# Patient Record
Sex: Male | Born: 1955 | Race: White | Hispanic: No | Marital: Married | Smoking: Former smoker
Health system: Southern US, Community
[De-identification: ages and names within clinical notes are randomized; demographics above are authoritative.]

## PROBLEM LIST (undated history)

## (undated) DIAGNOSIS — K2270 Barrett's esophagus without dysplasia: Secondary | ICD-10-CM

## (undated) DIAGNOSIS — K219 Gastro-esophageal reflux disease without esophagitis: Secondary | ICD-10-CM

## (undated) HISTORY — DX: Gastro-esophageal reflux disease without esophagitis: K21.9

## (undated) HISTORY — DX: Barrett's esophagus without dysplasia: K22.70

---

## 1999-12-29 ENCOUNTER — Other Ambulatory Visit: Admission: RE | Admit: 1999-12-29 | Discharge: 1999-12-29 | Payer: Self-pay | Admitting: Gastroenterology

## 2000-01-08 ENCOUNTER — Encounter (HOSPITAL_COMMUNITY): Admission: RE | Admit: 2000-01-08 | Discharge: 2000-04-07 | Payer: Self-pay | Admitting: Gastroenterology

## 2000-01-08 ENCOUNTER — Encounter: Payer: Self-pay | Admitting: Gastroenterology

## 2018-07-04 ENCOUNTER — Other Ambulatory Visit (INDEPENDENT_AMBULATORY_CARE_PROVIDER_SITE_OTHER): Payer: BLUE CROSS/BLUE SHIELD

## 2018-07-04 ENCOUNTER — Ambulatory Visit: Payer: BLUE CROSS/BLUE SHIELD | Admitting: Internal Medicine

## 2018-07-04 ENCOUNTER — Encounter: Payer: Self-pay | Admitting: Internal Medicine

## 2018-07-04 ENCOUNTER — Ambulatory Visit: Payer: Self-pay | Admitting: Internal Medicine

## 2018-07-04 VITALS — BP 116/74 | HR 127 | Ht 70.0 in | Wt 152.0 lb

## 2018-07-04 DIAGNOSIS — R5383 Other fatigue: Secondary | ICD-10-CM | POA: Diagnosis not present

## 2018-07-04 DIAGNOSIS — K22719 Barrett's esophagus with dysplasia, unspecified: Secondary | ICD-10-CM | POA: Diagnosis not present

## 2018-07-04 DIAGNOSIS — Z1159 Encounter for screening for other viral diseases: Secondary | ICD-10-CM

## 2018-07-04 DIAGNOSIS — K219 Gastro-esophageal reflux disease without esophagitis: Secondary | ICD-10-CM | POA: Insufficient documentation

## 2018-07-04 DIAGNOSIS — Z0001 Encounter for general adult medical examination with abnormal findings: Secondary | ICD-10-CM

## 2018-07-04 DIAGNOSIS — Z114 Encounter for screening for human immunodeficiency virus [HIV]: Secondary | ICD-10-CM | POA: Diagnosis not present

## 2018-07-04 DIAGNOSIS — E7800 Pure hypercholesterolemia, unspecified: Secondary | ICD-10-CM

## 2018-07-04 DIAGNOSIS — R634 Abnormal weight loss: Secondary | ICD-10-CM | POA: Diagnosis not present

## 2018-07-04 DIAGNOSIS — R630 Anorexia: Secondary | ICD-10-CM | POA: Diagnosis not present

## 2018-07-04 DIAGNOSIS — K2270 Barrett's esophagus without dysplasia: Secondary | ICD-10-CM

## 2018-07-04 HISTORY — DX: Barrett's esophagus without dysplasia: K22.70

## 2018-07-04 LAB — CBC WITH DIFFERENTIAL/PLATELET
BASOS ABS: 0.1 10*3/uL (ref 0.0–0.1)
Basophils Relative: 0.6 % (ref 0.0–3.0)
EOS ABS: 0.1 10*3/uL (ref 0.0–0.7)
Eosinophils Relative: 0.7 % (ref 0.0–5.0)
HEMATOCRIT: 40.4 % (ref 39.0–52.0)
Hemoglobin: 13.5 g/dL (ref 13.0–17.0)
LYMPHS ABS: 1.2 10*3/uL (ref 0.7–4.0)
LYMPHS PCT: 14.5 % (ref 12.0–46.0)
MCHC: 33.4 g/dL (ref 30.0–36.0)
MCV: 83.5 fl (ref 78.0–100.0)
MONOS PCT: 8.6 % (ref 3.0–12.0)
Monocytes Absolute: 0.7 10*3/uL (ref 0.1–1.0)
NEUTROS ABS: 6.3 10*3/uL (ref 1.4–7.7)
NEUTROS PCT: 75.6 % (ref 43.0–77.0)
PLATELETS: 368 10*3/uL (ref 150.0–400.0)
RBC: 4.84 Mil/uL (ref 4.22–5.81)
RDW: 15.6 % — ABNORMAL HIGH (ref 11.5–15.5)
WBC: 8.4 10*3/uL (ref 4.0–10.5)

## 2018-07-04 LAB — URINALYSIS, ROUTINE W REFLEX MICROSCOPIC
Hgb urine dipstick: NEGATIVE
Leukocytes, UA: NEGATIVE
NITRITE: NEGATIVE
SPECIFIC GRAVITY, URINE: 1.025 (ref 1.000–1.030)
TOTAL PROTEIN, URINE-UPE24: 30 — AB
UROBILINOGEN UA: 2 — AB (ref 0.0–1.0)
Urine Glucose: NEGATIVE
pH: 5.5 (ref 5.0–8.0)

## 2018-07-04 LAB — SEDIMENTATION RATE: SED RATE: 63 mm/h — AB (ref 0–20)

## 2018-07-04 NOTE — Assessment & Plan Note (Addendum)
High suspicion for malignancy related wt loss and lower appetite, for labs today as ordered  In addition to the time spent performing CPE, I spent an additional 25 minutes face to face,in which greater than 50% of this time was spent in counseling and coordination of care for patient's acute illness as documented, including the differential dx, treatment, further evaluation and other management of wt loss, fatigue, appetite loss, GERD, barretts

## 2018-07-04 NOTE — Progress Notes (Signed)
Subjective:    Patient ID: John Logan, male    DOB: July 18, 1956, 62 y.o.   MRN: [id number]  HPI  Here for wellness and establish as new pt;  Pt denies Chest pain, worsening SOB, DOE, wheezing, orthopnea, PND, worsening LE edema, palpitations, dizziness or syncope.  Pt denies neurological change such as new headache, facial or extremity weakness.  Pt denies polydipsia, polyuria. Pt states overall good compliance with treatment and medications, good tolerability, and has been trying to follow appropriate diet.  Pt denies worsening depressive symptoms, suicidal ideation or panic. No fever, night sweats, or other constitutional symptoms but has had recent onset low appetite and wt loss.  Pt states good ability with ADL's, has low fall risk, home safety reviewed and adequate, no other significant changes in hearing or vision, and not active with exercise.  Last tobacco was 27 yrs ago.   Has lost wt, none previous here a but has been 163 -167 mostly in the past.  Wt Readings from Last 3 Encounters:  07/04/18 152 lb (68.9 kg)  Has hx of barrets but last f/u was EGD with bx 20043,  On PPI since then , Denies worsening reflux, abd pain, dysphagia, n/v, bowel change or blood., but did have some hiccups with eating out once last wk.  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   Past Medical History:  Diagnosis Date  . Barrett esophagus 07/04/2018  . GERD (gastroesophageal reflux disease)    History reviewed. No pertinent surgical history.  reports that he has quit smoking. He has never used smokeless tobacco. He reports that he drank alcohol. He reports that he does not use drugs. family history includes Esophageal cancer in his father; Lung cancer in his mother. No Known Allergies Current Outpatient Medications on File Prior to Visit  Medication Sig Dispense Refill  . omeprazole (PRILOSEC OTC) 20 MG tablet Take 20 mg  by mouth daily.     No current facility-administered medications on file prior to visit.    Review of Systems Constitutional: Negative for other unusual diaphoresis, sweats, appetite or weight changes HENT: Negative for other worsening hearing loss, ear pain, facial swelling, mouth sores or neck stiffness.   Eyes: Negative for other worsening pain, redness or other visual disturbance.  Respiratory: Negative for other stridor or swelling Cardiovascular: Negative for other palpitations or other chest pain  Gastrointestinal: Negative for worsening diarrhea or loose stools, blood in stool, distention or other pain Genitourinary: Negative for hematuria, flank pain or other change in urine volume.  Musculoskeletal: Negative for myalgias or other joint swelling.  Skin: Negative for other color change, or other wound or worsening drainage.  Neurological: Negative for other syncope or numbness. Hematological: Negative for other adenopathy or swelling Psychiatric/Behavioral: Negative for hallucinations, other worsening agitation, SI, self-injury, or new decreased concentration All other system neg per pt    Objective:   Physical Exam BP 116/74   Pulse (!) 127   Ht 5\' 10"  (1.778 m)   Wt 152 lb (68.9 kg)   SpO2 98%   BMI 21.81 kg/m  VS noted, low normal wt but thinner recently Constitutional: Pt is oriented to person, place, and time. Appears well-developed and well-nourished, in no significant distress and comfortable Head: Normocephalic and atraumatic  Eyes: Conjunctivae and EOM are normal. Pupils are equal, round, and reactive to light Right Ear: External ear  normal without discharge Left Ear: External ear normal without discharge Nose: Nose without discharge or deformity Mouth/Throat: Oropharynx is without other ulcerations and moist  Neck: Normal range of motion. Neck supple. No JVD present. No tracheal deviation present or significant neck LA or mass Cardiovascular: Normal rate,  regular rhythm, normal heart sounds and intact distal pulses.   Pulmonary/Chest: WOB normal and breath sounds without rales or wheezing  Abdominal: Soft. Bowel sounds are normal. NT. No HSM  Musculoskeletal: Normal range of motion. Exhibits no edema Lymphadenopathy: Has no other cervical adenopathy.  Neurological: Pt is alert and oriented to person, place, and time. Pt has normal reflexes. No cranial nerve deficit. Motor grossly intact, Gait intact Skin: Skin is warm and dry. No rash noted or new ulcerations Psychiatric:  Has normal mood and affect. Behavior is normal without agitation\ No other exam findings No results found for: WBC, HGB, HCT, PLT, GLUCOSE, CHOL, TRIG, HDL, LDLDIRECT, LDLCALC, ALT, AST, NA, K, CL, CREATININE, BUN, CO2, TSH, PSA, INR, GLUF, HGBA1C, MICROALBUR     Assessment & Plan:

## 2018-07-04 NOTE — Assessment & Plan Note (Signed)
To GI referral as will need EGD with hx of barrets and now worsening wt loss

## 2018-07-04 NOTE — Assessment & Plan Note (Signed)

## 2018-07-04 NOTE — Assessment & Plan Note (Signed)
Also for CT abd/pelv/chest to r/o malignancy

## 2018-07-04 NOTE — Assessment & Plan Note (Signed)
To cont PPI,  to f/u any worsening symptoms or concerns

## 2018-07-04 NOTE — Assessment & Plan Note (Signed)
High suspicion for malignancy, for eval as above

## 2018-07-04 NOTE — Patient Instructions (Signed)
Please continue all other medications as before, and refills have been done if requested.  Please have the pharmacy call with any other refills you may need.  Please continue your efforts at being more active, low cholesterol diet, and weight control.  You are otherwise up to date with prevention measures today.  Please keep your appointments with your specialists as you may have planned  You will be contacted regarding the referral for: Gastroenterology, and CT scan for chest/abdomen/pelvis  Please go to the LAB in the Basement (turn left off the elevator) for the tests to be done today  You will be contacted by phone if any changes need to be made immediately.  Otherwise, you will receive a letter about your results with an explanation, but please check with MyChart first.  Please remember to sign up for MyChart if you have not done so, as this will be important to you in the future with finding out test results, communicating by private email, and scheduling acute appointments online when needed.  Please return in 6 months, or sooner if needed

## 2018-07-05 ENCOUNTER — Encounter: Payer: Self-pay | Admitting: Nurse Practitioner

## 2018-07-05 ENCOUNTER — Encounter: Payer: Self-pay | Admitting: Internal Medicine

## 2018-07-05 ENCOUNTER — Other Ambulatory Visit: Payer: Self-pay | Admitting: Internal Medicine

## 2018-07-05 DIAGNOSIS — R739 Hyperglycemia, unspecified: Secondary | ICD-10-CM

## 2018-07-05 DIAGNOSIS — R945 Abnormal results of liver function studies: Secondary | ICD-10-CM

## 2018-07-05 DIAGNOSIS — R7989 Other specified abnormal findings of blood chemistry: Secondary | ICD-10-CM

## 2018-07-05 LAB — BASIC METABOLIC PANEL
BUN: 22 mg/dL (ref 6–23)
CALCIUM: 9.8 mg/dL (ref 8.4–10.5)
CHLORIDE: 97 meq/L (ref 96–112)
CO2: 24 meq/L (ref 19–32)
CREATININE: 1.16 mg/dL (ref 0.40–1.50)
GFR: 67.74 mL/min (ref >60.00)
Glucose, Bld: 175 mg/dL — ABNORMAL HIGH (ref 70–99)
Potassium: 4.6 mEq/L (ref 3.5–5.1)
Sodium: 137 mEq/L (ref 135–145)

## 2018-07-05 LAB — HEPATITIS C ANTIBODY
Hepatitis C Ab: NONREACTIVE
SIGNAL TO CUT-OFF: 0.02 (ref <1.00)

## 2018-07-05 LAB — HIV ANTIBODY (ROUTINE TESTING W REFLEX): HIV 1&2 Ab, 4th Generation: NONREACTIVE

## 2018-07-05 LAB — LDL CHOLESTEROL, DIRECT: Direct LDL: 208 mg/dL

## 2018-07-05 LAB — LIPID PANEL
CHOL/HDL RATIO: 7
Cholesterol: 310 mg/dL — ABNORMAL HIGH (ref 0–200)
HDL: 44.1 mg/dL (ref >39.00)
NONHDL: 265.67
Triglycerides: 228 mg/dL — ABNORMAL HIGH (ref 0.0–149.0)
VLDL: 45.6 mg/dL — ABNORMAL HIGH (ref 0.0–40.0)

## 2018-07-05 LAB — PSA: PSA: 1.2 ng/mL (ref 0.10–4.00)

## 2018-07-05 LAB — HEPATIC FUNCTION PANEL
ALBUMIN: 4.1 g/dL (ref 3.5–5.2)
ALK PHOS: 427 U/L — AB (ref 39–117)
ALT: 109 U/L — ABNORMAL HIGH (ref 0–53)
AST: 159 U/L — AB (ref 0–37)
BILIRUBIN DIRECT: 1.2 mg/dL — AB (ref 0.0–0.3)
Total Bilirubin: 2 mg/dL — ABNORMAL HIGH (ref 0.2–1.2)
Total Protein: 7.2 g/dL (ref 6.0–8.3)

## 2018-07-05 LAB — VITAMIN D 25 HYDROXY (VIT D DEFICIENCY, FRACTURES): VITD: 40.03 ng/mL (ref 30.00–100.00)

## 2018-07-05 LAB — TSH: TSH: 8.14 u[IU]/mL — AB (ref 0.35–4.50)

## 2018-07-05 LAB — CORTISOL: Cortisol, Plasma: 20.9 ug/dL

## 2018-07-05 LAB — VITAMIN B12

## 2018-07-06 ENCOUNTER — Telehealth: Payer: Self-pay

## 2018-07-06 NOTE — Telephone Encounter (Signed)
-----   Message from Biagio Borg, MD sent at 07/05/2018  5:59 PM EDT ----- Please also print and send letter I have done today in my stead to the patient

## 2018-07-06 NOTE — Telephone Encounter (Signed)
Pt has been informed and expressed understanding. He stated the he would like to rest today but he would come in tomorrow to have his lab test redone.   Cecille Rubin- please follow up with patient concerning the CT appointments. Thank you!

## 2018-07-07 ENCOUNTER — Other Ambulatory Visit (INDEPENDENT_AMBULATORY_CARE_PROVIDER_SITE_OTHER): Payer: BLUE CROSS/BLUE SHIELD

## 2018-07-07 DIAGNOSIS — R945 Abnormal results of liver function studies: Secondary | ICD-10-CM

## 2018-07-07 DIAGNOSIS — R7989 Other specified abnormal findings of blood chemistry: Secondary | ICD-10-CM

## 2018-07-07 DIAGNOSIS — R739 Hyperglycemia, unspecified: Secondary | ICD-10-CM

## 2018-07-07 LAB — HEPATIC FUNCTION PANEL
ALBUMIN: 3.9 g/dL (ref 3.5–5.2)
ALT: 106 U/L — AB (ref 0–53)
AST: 209 U/L — AB (ref 0–37)
Alkaline Phosphatase: 472 U/L — ABNORMAL HIGH (ref 39–117)
BILIRUBIN TOTAL: 2.2 mg/dL — AB (ref 0.2–1.2)
Bilirubin, Direct: 1.4 mg/dL — ABNORMAL HIGH (ref 0.0–0.3)
Total Protein: 7.7 g/dL (ref 6.0–8.3)

## 2018-07-07 LAB — TSH: TSH: 5.01 u[IU]/mL — AB (ref 0.35–4.50)

## 2018-07-07 LAB — HEMOGLOBIN A1C: Hgb A1c MFr Bld: 5.9 % (ref 4.6–6.5)

## 2018-07-07 LAB — T4, FREE: Free T4: 0.84 ng/dL (ref 0.60–1.60)

## 2018-07-07 NOTE — Telephone Encounter (Signed)
Pt has been scheduled.  °

## 2018-07-07 NOTE — Telephone Encounter (Signed)
Sent msg to [facility] to contact pt to schedule. Left a msg on pt's vm to call me back to inform

## 2018-07-09 ENCOUNTER — Other Ambulatory Visit: Payer: Self-pay | Admitting: Internal Medicine

## 2018-07-09 ENCOUNTER — Encounter: Payer: Self-pay | Admitting: Internal Medicine

## 2018-07-09 MED ORDER — LEVOTHYROXINE SODIUM 25 MCG PO TABS: 25.0000 ug | ORAL_TABLET | Freq: Every day | ORAL | 3 refills | Status: AC

## 2018-07-09 NOTE — Progress Notes (Signed)
shirron or lucy to please print and send letter in my stead, thanks

## 2018-07-11 ENCOUNTER — Other Ambulatory Visit: Payer: Self-pay

## 2018-07-11 ENCOUNTER — Telehealth: Payer: Self-pay | Admitting: Internal Medicine

## 2018-07-11 ENCOUNTER — Inpatient Hospital Stay (HOSPITAL_COMMUNITY)
Admission: EM | Admit: 2018-07-11 | Discharge: 2018-07-13 | DRG: 436 | Disposition: A | Discharge status: Home / Self Care | Payer: BLUE CROSS/BLUE SHIELD | Attending: Internal Medicine | Admitting: Internal Medicine

## 2018-07-11 ENCOUNTER — Telehealth: Payer: Self-pay

## 2018-07-11 ENCOUNTER — Encounter (HOSPITAL_COMMUNITY): Payer: Self-pay | Admitting: Emergency Medicine

## 2018-07-11 ENCOUNTER — Emergency Department (HOSPITAL_COMMUNITY): Payer: BLUE CROSS/BLUE SHIELD

## 2018-07-11 DIAGNOSIS — K769 Liver disease, unspecified: Secondary | ICD-10-CM | POA: Diagnosis not present

## 2018-07-11 DIAGNOSIS — R7989 Other specified abnormal findings of blood chemistry: Secondary | ICD-10-CM

## 2018-07-11 DIAGNOSIS — K2270 Barrett's esophagus without dysplasia: Secondary | ICD-10-CM | POA: Diagnosis present

## 2018-07-11 DIAGNOSIS — Z87891 Personal history of nicotine dependence: Secondary | ICD-10-CM

## 2018-07-11 DIAGNOSIS — R180 Malignant ascites: Secondary | ICD-10-CM | POA: Diagnosis present

## 2018-07-11 DIAGNOSIS — Z7982 Long term (current) use of aspirin: Secondary | ICD-10-CM

## 2018-07-11 DIAGNOSIS — E440 Moderate protein-calorie malnutrition: Secondary | ICD-10-CM

## 2018-07-11 DIAGNOSIS — C801 Malignant (primary) neoplasm, unspecified: Secondary | ICD-10-CM | POA: Diagnosis present

## 2018-07-11 DIAGNOSIS — R918 Other nonspecific abnormal finding of lung field: Secondary | ICD-10-CM | POA: Diagnosis present

## 2018-07-11 DIAGNOSIS — K449 Diaphragmatic hernia without obstruction or gangrene: Secondary | ICD-10-CM | POA: Diagnosis present

## 2018-07-11 DIAGNOSIS — Z800 Family history of malignant neoplasm of digestive organs: Secondary | ICD-10-CM

## 2018-07-11 DIAGNOSIS — Z6823 Body mass index (BMI) 23.0-23.9, adult: Secondary | ICD-10-CM

## 2018-07-11 DIAGNOSIS — R945 Abnormal results of liver function studies: Secondary | ICD-10-CM

## 2018-07-11 DIAGNOSIS — C787 Secondary malignant neoplasm of liver and intrahepatic bile duct: Principal | ICD-10-CM | POA: Diagnosis present

## 2018-07-11 DIAGNOSIS — R17 Unspecified jaundice: Secondary | ICD-10-CM

## 2018-07-11 DIAGNOSIS — C799 Secondary malignant neoplasm of unspecified site: Secondary | ICD-10-CM | POA: Diagnosis present

## 2018-07-11 DIAGNOSIS — E039 Hypothyroidism, unspecified: Secondary | ICD-10-CM | POA: Diagnosis present

## 2018-07-11 DIAGNOSIS — K219 Gastro-esophageal reflux disease without esophagitis: Secondary | ICD-10-CM | POA: Diagnosis present

## 2018-07-11 DIAGNOSIS — Z801 Family history of malignant neoplasm of trachea, bronchus and lung: Secondary | ICD-10-CM

## 2018-07-11 DIAGNOSIS — Z79890 Hormone replacement therapy (postmenopausal): Secondary | ICD-10-CM | POA: Diagnosis not present

## 2018-07-11 DIAGNOSIS — R188 Other ascites: Secondary | ICD-10-CM

## 2018-07-11 DIAGNOSIS — R531 Weakness: Secondary | ICD-10-CM | POA: Diagnosis present

## 2018-07-11 LAB — COMPREHENSIVE METABOLIC PANEL
ALT: 152 U/L — AB (ref 0–44)
ANION GAP: 14 (ref 5–15)
AST: 266 U/L — ABNORMAL HIGH (ref 15–41)
Albumin: 3.6 g/dL (ref 3.5–5.0)
Alkaline Phosphatase: 540 U/L — ABNORMAL HIGH (ref 38–126)
BUN: 37 mg/dL — ABNORMAL HIGH (ref 8–23)
CO2: 25 mmol/L (ref 22–32)
CREATININE: 1.1 mg/dL (ref 0.61–1.24)
Calcium: 10 mg/dL (ref 8.9–10.3)
Chloride: 98 mmol/L (ref 98–111)
GFR calc non Af Amer: >60 mL/min (ref >60)
Glucose, Bld: 133 mg/dL — ABNORMAL HIGH (ref 70–99)
Potassium: 4.9 mmol/L (ref 3.5–5.1)
SODIUM: 137 mmol/L (ref 135–145)
Total Bilirubin: 4.2 mg/dL — ABNORMAL HIGH (ref 0.3–1.2)
Total Protein: 8.3 g/dL — ABNORMAL HIGH (ref 6.5–8.1)

## 2018-07-11 LAB — CBC WITH DIFFERENTIAL/PLATELET
Basophils Absolute: 0 10*3/uL (ref 0.0–0.1)
Basophils Relative: 0 %
EOS ABS: 0 10*3/uL (ref 0.0–0.7)
EOS PCT: 0 %
HCT: 38.6 % — ABNORMAL LOW (ref 39.0–52.0)
Hemoglobin: 12.8 g/dL — ABNORMAL LOW (ref 13.0–17.0)
LYMPHS ABS: 0.9 10*3/uL (ref 0.7–4.0)
LYMPHS PCT: 10 %
MCH: 27.6 pg (ref 26.0–34.0)
MCHC: 33.2 g/dL (ref 30.0–36.0)
MCV: 83.4 fL (ref 78.0–100.0)
MONO ABS: 0.8 10*3/uL (ref 0.1–1.0)
MONOS PCT: 10 %
NEUTROS PCT: 80 %
Neutro Abs: 6.9 10*3/uL (ref 1.7–7.7)
PLATELETS: 442 10*3/uL — AB (ref 150–400)
RBC: 4.63 MIL/uL (ref 4.22–5.81)
RDW: 16.9 % — ABNORMAL HIGH (ref 11.5–15.5)
WBC: 8.6 10*3/uL (ref 4.0–10.5)

## 2018-07-11 LAB — URINALYSIS, ROUTINE W REFLEX MICROSCOPIC
BILIRUBIN URINE: NEGATIVE
Glucose, UA: NEGATIVE mg/dL
Hgb urine dipstick: NEGATIVE
Ketones, ur: 5 mg/dL — AB
Leukocytes, UA: NEGATIVE
Nitrite: NEGATIVE
Protein, ur: NEGATIVE mg/dL
Specific Gravity, Urine: >1.046 — ABNORMAL HIGH (ref 1.005–1.030)
pH: 5 (ref 5.0–8.0)

## 2018-07-11 LAB — HCV RNA,QUANTITATIVE REAL TIME PCR
HCV Quantitative Log: <1.18 Log IU/mL
HCV RNA, PCR, QN: <15 IU/mL

## 2018-07-11 LAB — I-STAT TROPONIN, ED: TROPONIN I, POC: 0 ng/mL (ref 0.00–0.08)

## 2018-07-11 LAB — HEPATITIS PANEL, ACUTE
HEP A IGM: NONREACTIVE
HEP B C IGM: NONREACTIVE
HEP B S AG: NONREACTIVE
HEP C AB: REACTIVE — AB
SIGNAL TO CUT-OFF: 6.4 — AB (ref <1.00)

## 2018-07-11 LAB — LIPASE, BLOOD: LIPASE: 57 U/L — AB (ref 11–51)

## 2018-07-11 MED ORDER — IOPAMIDOL (ISOVUE-300) INJECTION 61%
INTRAVENOUS | Status: AC
  Filled 2018-07-11: qty 100

## 2018-07-11 MED ORDER — OXYCODONE-ACETAMINOPHEN 5-325 MG PO TABS
1.0000 | ORAL_TABLET | Freq: Four times a day (QID) | ORAL | Status: DC | PRN
  Administered 2018-07-11 – 2018-07-12 (×3): 2 via ORAL
  Administered 2018-07-12: 1 via ORAL
  Administered 2018-07-13: 2 via ORAL
  Filled 2018-07-11: qty 2
  Filled 2018-07-11: qty 1
  Filled 2018-07-11 (×5): qty 2

## 2018-07-11 MED ORDER — SODIUM CHLORIDE 0.9 % IV SOLN
INTRAVENOUS | Status: DC
  Administered 2018-07-11: 1000 mL via INTRAVENOUS
  Administered 2018-07-12 – 2018-07-13 (×3): via INTRAVENOUS

## 2018-07-11 MED ORDER — MORPHINE SULFATE (PF) 4 MG/ML IV SOLN
4.0000 mg | Freq: Once | INTRAVENOUS | Status: AC
  Administered 2018-07-11: 4 mg via INTRAVENOUS
  Filled 2018-07-11: qty 1

## 2018-07-11 MED ORDER — HYDROMORPHONE HCL 1 MG/ML IJ SOLN
0.5000 mg | INTRAMUSCULAR | Status: DC | PRN
  Administered 2018-07-11 – 2018-07-13 (×8): 0.5 mg via INTRAVENOUS
  Filled 2018-07-11 (×8): qty 1

## 2018-07-11 MED ORDER — ENOXAPARIN SODIUM 40 MG/0.4ML ~~LOC~~ SOLN: 40.0000 mg | SUBCUTANEOUS | Status: DC

## 2018-07-11 MED ORDER — SODIUM CHLORIDE 0.9 % IV BOLUS
1000.0000 mL | Freq: Once | INTRAVENOUS | Status: AC
  Administered 2018-07-11: 1000 mL via INTRAVENOUS

## 2018-07-11 MED ORDER — ENSURE ENLIVE PO LIQD
237.0000 mL | Freq: Two times a day (BID) | ORAL | Status: DC
  Administered 2018-07-13: 237 mL via ORAL

## 2018-07-11 MED ORDER — DEXTROSE-NACL 5-0.45 % IV SOLN
INTRAVENOUS | Status: DC
  Administered 2018-07-11: 17:00:00 via INTRAVENOUS

## 2018-07-11 MED ORDER — HYDROMORPHONE HCL 1 MG/ML IJ SOLN
1.0000 mg | Freq: Once | INTRAMUSCULAR | Status: AC
  Administered 2018-07-11: 1 mg via INTRAVENOUS
  Filled 2018-07-11: qty 1

## 2018-07-11 MED ORDER — IOPAMIDOL (ISOVUE-300) INJECTION 61%
100.0000 mL | Freq: Once | INTRAVENOUS | Status: AC | PRN
  Administered 2018-07-11: 100 mL via INTRAVENOUS

## 2018-07-11 NOTE — ED Triage Notes (Signed)
Per [facility] pt from home for weakness/fatigue, weight loss of 15 lbs in past month and also c/o abd pains. Recently saw his PCP and blood work showed hepatitis.  Pt adds that he was bit about year ago by two ticks and wondering about Lyme Disease.  Vitals: 138HR, 118/90, 97% on RA, CBG 124

## 2018-07-11 NOTE — Progress Notes (Signed)
Mail letter per MD request../lmb

## 2018-07-11 NOTE — Telephone Encounter (Signed)
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   This may or may not mean an active infection, and the test is being forwarded automatically to check the Hepatitis C Quantitative RNA.  If this test is positive, you have an active infection and would need referral to the [facility] infectious disease clinic.  Also the thyroid test is very mild abnormal which means you have very mild low thyroid condition.  This is incidental to everything else, but we should treat with a low dose of thyroid medication called levothyroxine 25 mcg per day.  Also of note is that the Hgba1c is normal, which means you do Not have new diabetes.      Shirron or Lucy to please inform pt of the above, make sure he understands one test above is still pending, we still need the CT results asap, and I will send a new prescription for this thyroid med that he should start soon but may not actually make him feel much different (this small dose is just "fine tuning")

## 2018-07-11 NOTE — ED Notes (Signed)
ED TO INPATIENT HANDOFF REPORT  Name/Age/Gender Oswaldo Conroy 62 y.o. male  Code Status    Code Status Orders  (From admission, onward)        Start     Ordered   07/11/18 1720  Full code  Continuous     07/11/18 1720    Code Status History    This patient has a current code status but no historical code status.      Home/SNF/Other Home  Chief Complaint weakness  Level of Care/Admitting Diagnosis ED Disposition    ED Disposition Condition [facility] Area: [facility] [[id number]]  Level of Care: Med-Surg [16]  Diagnosis: Metastatic carcinoma [facility]) [[id number]]  Admitting Physician: Georgette Shell [[id number]]  Attending Physician: Georgette Shell [[id number]]  Estimated length of stay: 3 - 4 days  Certification:: I certify this patient will need inpatient services for at least 2 midnights  PT Class (Do Not Modify): Inpatient [101]  PT Acc Code (Do Not Modify): Private [1]       Medical History Past Medical History:  Diagnosis Date  . Barrett esophagus 07/04/2018  . GERD (gastroesophageal reflux disease)     Allergies No Known Allergies  IV Location/Drains/Wounds Patient Lines/Drains/Airways Status   Active Line/Drains/Airways    Name:   Placement date:   Placement time:   Site:   Days:   Peripheral IV 07/11/18 Left Antecubital   07/11/18    1356    Antecubital   less than 1          Labs/Imaging Results for orders placed or performed during the hospital encounter of 07/11/18 (from the past 48 hour(s))  CBC with Differential     Status: Abnormal   Collection Time: 07/11/18  1:27 PM  Result Value Ref Range   WBC 8.6 4.0 - 10.5 K/uL   RBC 4.63 4.22 - 5.81 MIL/uL   Hemoglobin 12.8 (L) 13.0 - 17.0 g/dL   HCT 38.6 (L) 39.0 - 52.0 %   MCV 83.4 78.0 - 100.0 fL   MCH 27.6 26.0 - 34.0 pg   MCHC 33.2 30.0 - 36.0 g/dL   RDW 16.9 (H) 11.5 - 15.5 %   Platelets 442 (H) 150 - 400 K/uL   Neutrophils Relative % 80 %    Neutro Abs 6.9 1.7 - 7.7 K/uL   Lymphocytes Relative 10 %   Lymphs Abs 0.9 0.7 - 4.0 K/uL   Monocytes Relative 10 %   Monocytes Absolute 0.8 0.1 - 1.0 K/uL   Eosinophils Relative 0 %   Eosinophils Absolute 0.0 0.0 - 0.7 K/uL   Basophils Relative 0 %   Basophils Absolute 0.0 0.0 - 0.1 K/uL    Comment: Performed at [facility], [place] [address] [place], [place] [address]  Comprehensive metabolic panel     Status: Abnormal   Collection Time: 07/11/18  1:27 PM  Result Value Ref Range   Sodium 137 135 - 145 mmol/L   Potassium 4.9 3.5 - 5.1 mmol/L   Chloride 98 98 - 111 mmol/L   CO2 25 22 - 32 mmol/L   Glucose, Bld 133 (H) 70 - 99 mg/dL   BUN 37 (H) 8 - 23 mg/dL   Creatinine, Ser 1.10 0.61 - 1.24 mg/dL   Calcium 10.0 8.9 - 10.3 mg/dL   Total Protein 8.3 (H) 6.5 - 8.1 g/dL   Albumin 3.6 3.5 - 5.0 g/dL   AST 266 (H) 15 - 41 U/L  ALT 152 (H) 0 - 44 U/L   Alkaline Phosphatase 540 (H) 38 - 126 U/L   Total Bilirubin 4.2 (H) 0.3 - 1.2 mg/dL   GFR calc non Af Amer >60 >60 mL/min   GFR calc Af Amer >60 >60 mL/min    Comment: (NOTE) The eGFR has been calculated using the CKD EPI equation. This calculation has not been validated in all clinical situations. eGFR's persistently <60 mL/min signify possible Chronic Kidney Disease.    Anion gap 14 5 - 15    Comment: Performed at [facility], [place] [address], [place], [place] [address]  Lipase, blood     Status: Abnormal   Collection Time: 07/11/18  1:27 PM  Result Value Ref Range   Lipase 57 (H) 11 - 51 U/L    Comment: Performed at [facility], [place] [address], [place], [place] [address]  I-stat troponin, ED     Status: None   Collection Time: 07/11/18  1:45 PM  Result Value Ref Range   Troponin i, poc 0.00 0.00 - 0.08 ng/mL   Comment 3            Comment: Due to the release kinetics of cTnI, a negative result within the first hours of the onset of symptoms does not rule  out myocardial infarction with certainty. If myocardial infarction is still suspected, repeat the test at appropriate intervals.   Urinalysis, Routine w reflex microscopic     Status: Abnormal   Collection Time: 07/11/18  5:20 PM  Result Value Ref Range   Color, Urine AMBER (A) YELLOW    Comment: BIOCHEMICALS MAY BE AFFECTED BY COLOR   APPearance CLEAR CLEAR   Specific Gravity, Urine >1.046 (H) 1.005 - 1.030   pH 5.0 5.0 - 8.0   Glucose, UA NEGATIVE NEGATIVE mg/dL   Hgb urine dipstick NEGATIVE NEGATIVE   Bilirubin Urine NEGATIVE NEGATIVE   Ketones, ur 5 (A) NEGATIVE mg/dL   Protein, ur NEGATIVE NEGATIVE mg/dL   Nitrite NEGATIVE NEGATIVE   Leukocytes, UA NEGATIVE NEGATIVE    Comment: Performed at [facility], [place] [address], [place],  [address]   Ct Chest W Contrast  Result Date: 07/11/2018 CLINICAL DATA:  Weakness, fatigue, weight loss, abdominal pain. Hepatitis. EXAM: CT CHEST, ABDOMEN, AND PELVIS WITH CONTRAST TECHNIQUE: Multidetector CT imaging of the chest, abdomen and pelvis was performed following the standard protocol during bolus administration of intravenous contrast. CONTRAST:  100 cc ISOVUE-300 IOPAMIDOL (ISOVUE-300) INJECTION 61% COMPARISON:  None. FINDINGS: CT CHEST FINDINGS Cardiovascular: Vascular structures are unremarkable. Heart size normal. No pericardial effusion. Mediastinum/Nodes: No pathologically enlarged mediastinal hilar or axillary lymph. Esophagus is grossly unremarkable. Moderate to large hiatal hernia. Lungs/Pleura: Several scattered bilateral pulmonary nodules are seen in a hematogenous distribution, measuring up to 8 x 10 mm in the left lower lobe (series 5, image 91). No pleural fluid. Airway is unremarkable. Musculoskeletal: No worrisome lytic or sclerotic lesions. CT ABDOMEN PELVIS FINDINGS Hepatobiliary: Heterogeneous lesions are seen throughout liver, measuring up to 5.1 x 10.3 cm in the right hepatic lobe. Gallbladder is  unremarkable. No biliary ductal dilatation. Pancreas: Negative. Spleen: Negative. Adrenals/Urinary Tract: Adrenal glands are unremarkable. Probable renal sinus cysts bilaterally. Additional low-attenuation lesions in kidney measure up to 1 5 cm and are likely cysts. Ureters are decompressed. Bladder is grossly unremarkable. Stomach/Bowel: Moderate hiatal hernia. Stomach, small bowel, appendix and colon are unremarkable. Vascular/Lymphatic: Atherosclerotic calcification of the arterial vasculature without abdominal aortic aneurysm. Gastrohepatic ligament  lymph nodes measure up to 11 mm. Periportal lymph nodes measure up to 10 mm. Reproductive: Prostate is visualized. Other: Small to moderate pelvic free fluid. Mild stranding and a small amount of fluid extend inferiorly from the right hepatic lobe along the right paracolic gutter. Musculoskeletal: Small area of sclerosis is seen in the medial right iliac wing, adjacent to the sacroiliac joint nonspecific. Otherwise, no worrisome lytic or sclerotic lesions. IMPRESSION: 1. Pulmonary nodules and heterogeneous lesions throughout the liver are indicative of metastatic disease. 2. Trace fluid and inflammatory stranding extend from the right hepatic lobe along the right paracolic gutter. Small to moderate pelvic ascites. Peritoneal carcinomatosis cannot be excluded. 3. Borderline enlarged gastrohepatic ligament and periportal lymph nodes. 4. Small area of sclerosis in the medial right iliac wing is indeterminate. Continued attention on follow-up exams is warranted as metastatic disease cannot be excluded. 5. Moderate hiatal hernia. 6.  Aortic atherosclerosis (ICD10-170.0). Electronically Signed   By: Lorin Picket M.D.   On: 07/11/2018 15:42   Ct Abdomen Pelvis W Contrast  Result Date: 07/11/2018 CLINICAL DATA:  Weakness, fatigue, weight loss, abdominal pain. Hepatitis. EXAM: CT CHEST, ABDOMEN, AND PELVIS WITH CONTRAST TECHNIQUE: Multidetector CT imaging of the chest,  abdomen and pelvis was performed following the standard protocol during bolus administration of intravenous contrast. CONTRAST:  100 cc ISOVUE-300 IOPAMIDOL (ISOVUE-300) INJECTION 61% COMPARISON:  None. FINDINGS: CT CHEST FINDINGS Cardiovascular: Vascular structures are unremarkable. Heart size normal. No pericardial effusion. Mediastinum/Nodes: No pathologically enlarged mediastinal hilar or axillary lymph. Esophagus is grossly unremarkable. Moderate to large hiatal hernia. Lungs/Pleura: Several scattered bilateral pulmonary nodules are seen in a hematogenous distribution, measuring up to 8 x 10 mm in the left lower lobe (series 5, image 91). No pleural fluid. Airway is unremarkable. Musculoskeletal: No worrisome lytic or sclerotic lesions. CT ABDOMEN PELVIS FINDINGS Hepatobiliary: Heterogeneous lesions are seen throughout liver, measuring up to 5.1 x 10.3 cm in the right hepatic lobe. Gallbladder is unremarkable. No biliary ductal dilatation. Pancreas: Negative. Spleen: Negative. Adrenals/Urinary Tract: Adrenal glands are unremarkable. Probable renal sinus cysts bilaterally. Additional low-attenuation lesions in kidney measure up to 1 5 cm and are likely cysts. Ureters are decompressed. Bladder is grossly unremarkable. Stomach/Bowel: Moderate hiatal hernia. Stomach, small bowel, appendix and colon are unremarkable. Vascular/Lymphatic: Atherosclerotic calcification of the arterial vasculature without abdominal aortic aneurysm. Gastrohepatic ligament lymph nodes measure up to 11 mm. Periportal lymph nodes measure up to 10 mm. Reproductive: Prostate is visualized. Other: Small to moderate pelvic free fluid. Mild stranding and a small amount of fluid extend inferiorly from the right hepatic lobe along the right paracolic gutter. Musculoskeletal: Small area of sclerosis is seen in the medial right iliac wing, adjacent to the sacroiliac joint nonspecific. Otherwise, no worrisome lytic or sclerotic lesions. IMPRESSION:  1. Pulmonary nodules and heterogeneous lesions throughout the liver are indicative of metastatic disease. 2. Trace fluid and inflammatory stranding extend from the right hepatic lobe along the right paracolic gutter. Small to moderate pelvic ascites. Peritoneal carcinomatosis cannot be excluded. 3. Borderline enlarged gastrohepatic ligament and periportal lymph nodes. 4. Small area of sclerosis in the medial right iliac wing is indeterminate. Continued attention on follow-up exams is warranted as metastatic disease cannot be excluded. 5. Moderate hiatal hernia. 6.  Aortic atherosclerosis (ICD10-170.0). Electronically Signed   By: Lorin Picket M.D.   On: 07/11/2018 15:42    Pending Labs Unresulted Labs (From admission, onward)   Start     Ordered   07/12/18 0500  CBC  Tomorrow morning,   R     07/11/18 1722   07/12/18 0500  Hepatic function panel  Daily,   R     07/11/18 1722   07/12/18 1761  Basic metabolic panel  Daily,   R     07/11/18 1722   07/12/18 0500  Protime-INR  Tomorrow morning,   R     07/11/18 1722      Vitals/Pain Today's Vitals   07/11/18 1545 07/11/18 1600 07/11/18 1615 07/11/18 1630  BP:      Pulse:      Resp: (!) 25 (!) 33 (!) 31 (!) 34  Temp:      TempSrc:      SpO2:        Isolation Precautions No active isolations  Medications Medications  oxyCODONE-acetaminophen (PERCOCET/ROXICET) 5-325 MG per tablet 1-2 tablet (2 tablets Oral Given 07/11/18 1626)  enoxaparin (LOVENOX) injection 40 mg (has no administration in time range)  HYDROmorphone (DILAUDID) injection 0.5 mg (has no administration in time range)  0.9 %  sodium chloride infusion (has no administration in time range)  morphine 4 MG/ML injection 4 mg (4 mg Intravenous Given 07/11/18 1344)  sodium chloride 0.9 % bolus 1,000 mL (0 mLs Intravenous Stopped 07/11/18 1447)  iopamidol (ISOVUE-300) 61 % injection 100 mL (100 mLs Intravenous Contrast Given 07/11/18 1447)    Mobility walks

## 2018-07-11 NOTE — ED Provider Notes (Signed)
[facility] DEPT Provider Note   CSN: [id number] Arrival date & time: 07/11/18  1229     History   Chief Complaint Chief Complaint  Patient presents with  . Weakness  . Weight Loss    HPI John Logan is a 62 y.o. male.  He is presented to the emergency department with 1 month of unintended weight loss of about 20 pounds and fatigue.  Over the last 2 to 3 days he has had abdominal pain that is been midepigastric may be a little right-sided.  Said he said infrequent headaches no visual symptoms no cough no shortness of breath.  He has had no constipation or diarrhea.  His urine has been dark in color.  He has had some tick bites but does not notice any particular rashes or swollen joints.  He saw his PCP last week who drew some blood work and told him his liver numbers were elevated and is ordered him an outpatient CT chest and abdomen but his abdominal pain had gotten worse and so he was told to come here to get the imaging done.  There are no sick contacts at home he does not take any prescription medications other than just recently being prescribed some Synthroid.  He had a history of Barrett's esophagus and was on over-the-counter Prilosec.  He does not smoke drink or use any street drugs.  The history is provided by the patient.  Weakness  Primary symptoms include no focal weakness, no speech change, no visual change. This is a new problem. Episode onset: 1 month. The problem has been gradually worsening. There was no focality noted. There has been no fever. Associated symptoms include headaches (mild). Pertinent negatives include no shortness of breath, no chest pain, no vomiting, no altered mental status and no confusion. There were no medications administered prior to arrival. Associated medical issues do not include trauma or CVA.    Past Medical History:  Diagnosis Date  . Barrett esophagus 07/04/2018  . GERD (gastroesophageal reflux disease)      Patient Active Problem List   Diagnosis Date Noted  . Barrett esophagus 07/04/2018  . Encounter for well adult exam with abnormal findings 07/04/2018  . Weight loss 07/04/2018  . Appetite loss 07/04/2018  . Fatigue 07/04/2018  . GERD (gastroesophageal reflux disease)     History reviewed. No pertinent surgical history.      Home Medications    Prior to Admission medications   Medication Sig Start Date End Date Taking? Authorizing Provider  acetaminophen (TYLENOL) 500 MG tablet Take 500 mg by mouth every 6 (six) hours as needed for mild pain.   Yes [provider]  aspirin 81 MG chewable tablet Chew 162 mg by mouth daily.   Yes [provider]  Multiple Vitamin (MULTIVITAMIN WITH MINERALS) TABS tablet Take 1 tablet by mouth daily.   Yes [provider]  omeprazole (PRILOSEC OTC) 20 MG tablet Take 20 mg by mouth daily.   Yes [provider]  levothyroxine (SYNTHROID, LEVOTHROID) 25 MCG tablet Take 1 tablet (25 mcg total) by mouth daily before breakfast. 07/09/18   Biagio Borg, MD    Family History Family History  Problem Relation Age of Onset  . Lung cancer Mother   . Esophageal cancer Father     Social History Social History   Tobacco Use  . Smoking status: Former Research scientist (life sciences)  . Smokeless tobacco: Never Used  Substance Use Topics  . Alcohol use: Not Currently  .  Drug use: Never     Allergies   Patient has no known allergies.   Review of Systems Review of Systems  Constitutional: Positive for activity change, appetite change and fatigue. Negative for chills and fever.  HENT: Negative for ear pain and sore throat.   Eyes: Negative for pain and visual disturbance.  Respiratory: Negative for cough and shortness of breath.   Cardiovascular: Negative for chest pain and palpitations.  Gastrointestinal: Positive for abdominal pain. Negative for blood in stool, constipation, diarrhea, nausea and vomiting.  Genitourinary: Negative  for dysuria and hematuria.  Musculoskeletal: Negative for arthralgias and back pain.  Skin: Negative for color change and rash.  Neurological: Positive for weakness and headaches (mild). Negative for speech change, focal weakness, seizures and syncope.  Psychiatric/Behavioral: Negative for confusion.  All other systems reviewed and are negative.    Physical Exam Updated Vital Signs BP (!) 115/91 (BP Location: Left Arm)   Pulse (!) 138   Temp 98.3 F (36.8 C) (Oral)   Resp 17   SpO2 96%   Physical Exam  Constitutional: He appears well-developed and well-nourished.  HENT:  Head: Normocephalic and atraumatic.  Eyes: Pupils are equal, round, and reactive to light. Conjunctivae and EOM are normal.  Neck: Normal range of motion. Neck supple.  Cardiovascular: Regular rhythm, normal heart sounds and intact distal pulses. Tachycardia present.  No murmur heard. Pulmonary/Chest: Effort normal and breath sounds normal. No respiratory distress.  Abdominal: Soft. He exhibits no mass. There is tenderness (Mild diffuse upper). There is no rebound and no guarding.  Musculoskeletal: He exhibits no edema.  Neurological: He is alert.  Skin: Skin is warm and dry.  Psychiatric: He has a normal mood and affect.  Nursing note and vitals reviewed.    ED Treatments / Results  Labs (all labs ordered are listed, but only abnormal results are displayed) Labs Reviewed  CBC WITH DIFFERENTIAL/PLATELET - Abnormal; Notable for the following components:      Result Value   Hemoglobin 12.8 (*)    HCT 38.6 (*)    RDW 16.9 (*)    Platelets 442 (*)    All other components within normal limits  COMPREHENSIVE METABOLIC PANEL - Abnormal; Notable for the following components:   Glucose, Bld 133 (*)    BUN 37 (*)    Total Protein 8.3 (*)    AST 266 (*)    ALT 152 (*)    Alkaline Phosphatase 540 (*)    Total Bilirubin 4.2 (*)    All other components within normal limits  LIPASE, BLOOD - Abnormal; Notable  for the following components:   Lipase 57 (*)    All other components within normal limits  URINALYSIS, ROUTINE W REFLEX MICROSCOPIC - Abnormal; Notable for the following components:   Color, Urine AMBER (*)    Specific Gravity, Urine >1.046 (*)    Ketones, ur 5 (*)    All other components within normal limits  CBC - Abnormal; Notable for the following components:   RBC 3.92 (*)    Hemoglobin 10.9 (*)    HCT 33.5 (*)    RDW 17.4 (*)    All other components within normal limits  HEPATIC FUNCTION PANEL - Abnormal; Notable for the following components:   Albumin 2.9 (*)    AST 186 (*)    ALT 112 (*)    Alkaline Phosphatase 391 (*)    Total Bilirubin 3.2 (*)    Bilirubin, Direct 1.9 (*)    Indirect  Bilirubin 1.3 (*)    All other components within normal limits  BASIC METABOLIC PANEL - Abnormal; Notable for the following components:   Glucose, Bld 105 (*)    All other components within normal limits  PROTIME-INR - Abnormal; Notable for the following components:   Prothrombin Time 16.6 (*)    All other components within normal limits  I-STAT TROPONIN, ED    EKG EKG Interpretation  Date/Time:  Monday July 11 2018 12:42:23 EDT Ventricular Rate:  134 PR Interval:    QRS Duration: 103 QT Interval:  295 QTC Calculation: 441 R Axis:   83 Text Interpretation:  Sinus tachycardia Multiple premature complexes, vent & supraven Aberrant complex Borderline right axis deviation Borderline low voltage, extremity leads Probable anteroseptal infarct, old ST depr, consider ischemia, anterolateral lds Baseline wander in lead(s) I III aVL no prior to compare with Confirmed by Aletta Edouard [id number]) on 07/11/2018 12:49:53 PM   Radiology Ct Chest W Contrast  Result Date: 07/11/2018 CLINICAL DATA:  Weakness, fatigue, weight loss, abdominal pain. Hepatitis. EXAM: CT CHEST, ABDOMEN, AND PELVIS WITH CONTRAST TECHNIQUE: Multidetector CT imaging of the chest, abdomen and pelvis was performed following  the standard protocol during bolus administration of intravenous contrast. CONTRAST:  100 cc ISOVUE-300 IOPAMIDOL (ISOVUE-300) INJECTION 61% COMPARISON:  None. FINDINGS: CT CHEST FINDINGS Cardiovascular: Vascular structures are unremarkable. Heart size normal. No pericardial effusion. Mediastinum/Nodes: No pathologically enlarged mediastinal hilar or axillary lymph. Esophagus is grossly unremarkable. Moderate to large hiatal hernia. Lungs/Pleura: Several scattered bilateral pulmonary nodules are seen in a hematogenous distribution, measuring up to 8 x 10 mm in the left lower lobe (series 5, image 91). No pleural fluid. Airway is unremarkable. Musculoskeletal: No worrisome lytic or sclerotic lesions. CT ABDOMEN PELVIS FINDINGS Hepatobiliary: Heterogeneous lesions are seen throughout liver, measuring up to 5.1 x 10.3 cm in the right hepatic lobe. Gallbladder is unremarkable. No biliary ductal dilatation. Pancreas: Negative. Spleen: Negative. Adrenals/Urinary Tract: Adrenal glands are unremarkable. Probable renal sinus cysts bilaterally. Additional low-attenuation lesions in kidney measure up to 1 5 cm and are likely cysts. Ureters are decompressed. Bladder is grossly unremarkable. Stomach/Bowel: Moderate hiatal hernia. Stomach, small bowel, appendix and colon are unremarkable. Vascular/Lymphatic: Atherosclerotic calcification of the arterial vasculature without abdominal aortic aneurysm. Gastrohepatic ligament lymph nodes measure up to 11 mm. Periportal lymph nodes measure up to 10 mm. Reproductive: Prostate is visualized. Other: Small to moderate pelvic free fluid. Mild stranding and a small amount of fluid extend inferiorly from the right hepatic lobe along the right paracolic gutter. Musculoskeletal: Small area of sclerosis is seen in the medial right iliac wing, adjacent to the sacroiliac joint nonspecific. Otherwise, no worrisome lytic or sclerotic lesions. IMPRESSION: 1. Pulmonary nodules and heterogeneous  lesions throughout the liver are indicative of metastatic disease. 2. Trace fluid and inflammatory stranding extend from the right hepatic lobe along the right paracolic gutter. Small to moderate pelvic ascites. Peritoneal carcinomatosis cannot be excluded. 3. Borderline enlarged gastrohepatic ligament and periportal lymph nodes. 4. Small area of sclerosis in the medial right iliac wing is indeterminate. Continued attention on follow-up exams is warranted as metastatic disease cannot be excluded. 5. Moderate hiatal hernia. 6.  Aortic atherosclerosis (ICD10-170.0). Electronically Signed   By: Lorin Picket M.D.   On: 07/11/2018 15:42   Ct Abdomen Pelvis W Contrast  Result Date: 07/11/2018 CLINICAL DATA:  Weakness, fatigue, weight loss, abdominal pain. Hepatitis. EXAM: CT CHEST, ABDOMEN, AND PELVIS WITH CONTRAST TECHNIQUE: Multidetector CT imaging of the chest, abdomen and pelvis  was performed following the standard protocol during bolus administration of intravenous contrast. CONTRAST:  100 cc ISOVUE-300 IOPAMIDOL (ISOVUE-300) INJECTION 61% COMPARISON:  None. FINDINGS: CT CHEST FINDINGS Cardiovascular: Vascular structures are unremarkable. Heart size normal. No pericardial effusion. Mediastinum/Nodes: No pathologically enlarged mediastinal hilar or axillary lymph. Esophagus is grossly unremarkable. Moderate to large hiatal hernia. Lungs/Pleura: Several scattered bilateral pulmonary nodules are seen in a hematogenous distribution, measuring up to 8 x 10 mm in the left lower lobe (series 5, image 91). No pleural fluid. Airway is unremarkable. Musculoskeletal: No worrisome lytic or sclerotic lesions. CT ABDOMEN PELVIS FINDINGS Hepatobiliary: Heterogeneous lesions are seen throughout liver, measuring up to 5.1 x 10.3 cm in the right hepatic lobe. Gallbladder is unremarkable. No biliary ductal dilatation. Pancreas: Negative. Spleen: Negative. Adrenals/Urinary Tract: Adrenal glands are unremarkable. Probable renal  sinus cysts bilaterally. Additional low-attenuation lesions in kidney measure up to 1 5 cm and are likely cysts. Ureters are decompressed. Bladder is grossly unremarkable. Stomach/Bowel: Moderate hiatal hernia. Stomach, small bowel, appendix and colon are unremarkable. Vascular/Lymphatic: Atherosclerotic calcification of the arterial vasculature without abdominal aortic aneurysm. Gastrohepatic ligament lymph nodes measure up to 11 mm. Periportal lymph nodes measure up to 10 mm. Reproductive: Prostate is visualized. Other: Small to moderate pelvic free fluid. Mild stranding and a small amount of fluid extend inferiorly from the right hepatic lobe along the right paracolic gutter. Musculoskeletal: Small area of sclerosis is seen in the medial right iliac wing, adjacent to the sacroiliac joint nonspecific. Otherwise, no worrisome lytic or sclerotic lesions. IMPRESSION: 1. Pulmonary nodules and heterogeneous lesions throughout the liver are indicative of metastatic disease. 2. Trace fluid and inflammatory stranding extend from the right hepatic lobe along the right paracolic gutter. Small to moderate pelvic ascites. Peritoneal carcinomatosis cannot be excluded. 3. Borderline enlarged gastrohepatic ligament and periportal lymph nodes. 4. Small area of sclerosis in the medial right iliac wing is indeterminate. Continued attention on follow-up exams is warranted as metastatic disease cannot be excluded. 5. Moderate hiatal hernia. 6.  Aortic atherosclerosis (ICD10-170.0). Electronically Signed   By: Lorin Picket M.D.   On: 07/11/2018 15:42    Procedures Procedures (including critical care time)  Medications Ordered in ED Medications  morphine 4 MG/ML injection 4 mg (has no administration in time range)  sodium chloride 0.9 % bolus 1,000 mL (has no administration in time range)     Initial Impression / Assessment and Plan / ED Course  I have reviewed the triage vital signs and the nursing notes.  Pertinent  labs & imaging results that were available during my care of the patient were reviewed by me and considered in my medical decision making (see chart for details).  Clinical Course as of Jul 12 1126  Mon Jul 11, 2018  1606 Patient story concerning for some malignant process with his weight loss and now worsening liver disease.  His repeat liver numbers are slightly worse with an elevated bilirubin.  His CT is concerning for lymphadenopathy and multiple hepatic lesions with some peritoneal fluid.  I reviewed this with the patient and let her know my concerns that this potentially is malignant.  He states he still having a good amount of pain although the medicine helped and I felt he would do better with admission for more urgent work-up with his rising LFTs.  I discussed with Dr. Zigmund Daniel from the hospitalist service who will admit him.   [MB]    Clinical Course User Index [MB] Hayden Rasmussen, MD  Final Clinical Impressions(s) / ED Diagnoses   Final diagnoses:  Ascites  Liver lesion    ED Discharge Orders    None       Hayden Rasmussen, MD 07/12/18 1128

## 2018-07-11 NOTE — H&P (Signed)
History and Physical    John Logan:[id number] DOB: Mar 23, 1956 DOA: 07/11/2018  PCP: Biagio Borg, MD Patient coming from: home  Chief Complaint: weight loss  HPI: John Logan is a 62 y.o. male with medical history significant of Barrett's esophagus, hypo-thyroidism admitted with unintended weight loss of over 20 pounds in the last 1 month.  He works Doppler.  He has generalized weakness and fatigue.  He has no appetite.  Has been having abdominal pain in the upper abdomen more on the right side for few weeks now.  He was being worked up as an outpatient.  He saw his primary doctor a few days ago and saw his liver function test elevated and was ordering an outpatient CT of the chest and abdomen but patient's pain got worse so he decided to come to the ER.  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  He denies any nausea vomiting diarrhea constipation or change in bowel habits.  Denies any hematuria hematochezia cough fever chills or chest pain.  He reports he had a liver biopsy in 2003 for elevated liver enzymes.  That is all he can remember about that liver biopsy does not know the report of the liver biopsy.  He reported that he had some tick bites few weeks ago had not noticed any fever or painful joints.  He complains of generalized weakness but he is able to ambulate no dysphagia dysarthria headaches or changes with his vision. His main complaints at this time is abdominal pain which is better after getting morphine.   ED Course: Patient received IV fluids with D5 half-normal 100 cc an hour, morphine 4 mg, Percocet, and a UA was sent out.  Sodium 137 potassium 4.9 BUN 37 creatinine 1.10 alkaline phosphatase is 540 which is increased from 3 days ago which was 472, lipase 57, AST 266 and ALT 152 which is up from 209 and 106 3 days ago.  Total bilirubin is 4.2  which is up from 2.23 days ago.  White count 8.6 hemoglobin 12.8 platelet count 442.  Hepatitis panel was sent out on 07/07/2018 hepatitis A and B- negative hepatitis C antibody reactive.  HCVRNA not detected.  CT of the abdomen and pelvis showed pulmonary nodules and heterogeneous lesions throughout the liver indicating of metastatic disease, trace fluid and inflammatory stranding extending from the right hepatic lobe along the right paracolic gutter.  Small to moderate pelvic ascites peritoneal carcinomatosis cannot be excluded, borderline enlarged gastrohepatic ligament and periportal lymph nodes, small area of sclerosis in the medial right iliac wing is indeterminate.  Moderate hiatal hernia.  Review of Systems see above significant for weight loss generalized weakness. Ambulatory Status: Patient is ambulatory at baseline.  Past Medical History:  Diagnosis Date  . Barrett esophagus 07/04/2018  . GERD (gastroesophageal reflux disease)     History reviewed. No pertinent surgical history.  Social History   Socioeconomic History  . Marital status: Married    Spouse name: Not on file  . Number of children: Not on file  . Years of education: Not on file  . Highest education level: Not on file  Occupational History  . Not on file  Social Needs  . Financial resource strain: Not on file  . Food insecurity:    Worry: Not on file    Inability: Not on file  .  Transportation needs:    Medical: Not on file    Non-medical: Not on file  Tobacco Use  . Smoking status: Former Research scientist (life sciences)  . Smokeless tobacco: Never Used  Substance and Sexual Activity  . Alcohol use: Not Currently  . Drug use: Never  . Sexual activity: Yes    Partners: Female  Lifestyle  . Physical activity:    Days per week: Not on file    Minutes per session: Not on file  . Stress: Not on file  Relationships  . Social connections:    Talks on phone: Not on file    Gets together: Not on file    Attends religious service:  Not on file    Active member of club or organization: Not on file    Attends meetings of clubs or organizations: Not on file    Relationship status: Not on file  . Intimate partner violence:    Fear of current or ex partner: Not on file    Emotionally abused: Not on file    Physically abused: Not on file    Forced sexual activity: Not on file  Other Topics Concern  . Not on file  Social History Narrative  . Not on file    No Known Allergies  Family History  Problem Relation Age of Onset  . Lung cancer Mother   . Esophageal cancer Father       Prior to Admission medications   Medication Sig Start Date End Date Taking? Authorizing Provider  acetaminophen (TYLENOL) 500 MG tablet Take 500 mg by mouth every 6 (six) hours as needed for mild pain.   Yes [provider]  aspirin 81 MG chewable tablet Chew 162 mg by mouth daily.   Yes [provider]  Multiple Vitamin (MULTIVITAMIN WITH MINERALS) TABS tablet Take 1 tablet by mouth daily.   Yes [provider]  omeprazole (PRILOSEC OTC) 20 MG tablet Take 20 mg by mouth daily.   Yes [provider]  levothyroxine (SYNTHROID, LEVOTHROID) 25 MCG tablet Take 1 tablet (25 mcg total) by mouth daily before breakfast. 07/09/18   Biagio Borg, MD    Physical Exam: Vitals:   07/11/18 1545 07/11/18 1600 07/11/18 1615 07/11/18 1630  BP:      Pulse:      Resp: (!) 25 (!) 33 (!) 31 (!) 34  Temp:      TempSrc:      SpO2:         General: Appears mild distress Eyes: PERRL, EOMI, normal lids, iris ENT: grossly normal hearing, lips & tongue, mmm Neck:  no LAD, masses or thyromegaly Cardiovascular:  RRR, no m/r/g. No LE edema.  Respiratory:  CTA bilaterally, no w/r/r. Normal respiratory effort. Abdomen:  Soft, hepatomegaly present tender more on the right upper quadrant and epigastric area good bowel sounds Skin: no rash or induration seen on limited exam Musculoskeletal:  grossly normal tone BUE/BLE, good  ROM, no bony abnormality Psychiatric: grossly normal mood and affect, speech fluent and appropriate, AOx3 Neurologic: CN 2-12 grossly intact, moves all extremities in coordinated fashion, sensation intact  Labs on Admission: I have personally reviewed following labs and imaging studies  CBC: Recent Labs  Lab 07/04/18 1725 07/11/18 1327  WBC 8.4 8.6  NEUTROABS 6.3 6.9  HGB 13.5 12.8*  HCT 40.4 38.6*  MCV 83.5 83.4  PLT 368.0 027*   Basic Metabolic Panel: Recent Labs  Lab 07/04/18 1725 07/11/18 1327  NA 137 137  K 4.6  4.9  CL 97 98  CO2 24 25  GLUCOSE 175* 133*  BUN 22 37*  CREATININE 1.16 1.10  CALCIUM 9.8 10.0   GFR: Estimated Creatinine Clearance: 67.9 mL/min (by C-G formula based on SCr of 1.1 mg/dL). Liver Function Tests: Recent Labs  Lab 07/04/18 1725 07/07/18 0923 07/11/18 1327  AST 159* 209* 266*  ALT 109* 106* 152*  ALKPHOS 427* 472* 540*  BILITOT 2.0* 2.2* 4.2*  PROT 7.2 7.7 8.3*  ALBUMIN 4.1 3.9 3.6   Recent Labs  Lab 07/11/18 1327  LIPASE 57*   No results for input(s): AMMONIA in the last 168 hours. Coagulation Profile: No results for input(s): INR, PROTIME in the last 168 hours. Cardiac Enzymes: No results for input(s): CKTOTAL, CKMB, CKMBINDEX, TROPONINI in the last 168 hours. BNP (last 3 results) No results for input(s): PROBNP in the last 8760 hours. HbA1C: No results for input(s): HGBA1C in the last 72 hours. CBG: No results for input(s): GLUCAP in the last 168 hours. Lipid Profile: No results for input(s): CHOL, HDL, LDLCALC, TRIG, CHOLHDL, LDLDIRECT in the last 72 hours. Thyroid Function Tests: No results for input(s): TSH, T4TOTAL, FREET4, T3FREE, THYROIDAB in the last 72 hours. Anemia Panel: No results for input(s): VITAMINB12, FOLATE, FERRITIN, TIBC, IRON, RETICCTPCT in the last 72 hours. Urine analysis:    Component Value Date/Time   COLORURINE YELLOW 07/04/2018 1725   APPEARANCEUR CLEAR 07/04/2018 1725   LABSPEC 1.025  07/04/2018 1725   PHURINE 5.5 07/04/2018 1725   GLUCOSEU NEGATIVE 07/04/2018 1725   HGBUR NEGATIVE 07/04/2018 1725   BILIRUBINUR MODERATE (A) 07/04/2018 1725   KETONESUR TRACE (A) 07/04/2018 1725   UROBILINOGEN 2.0 (A) 07/04/2018 1725   NITRITE NEGATIVE 07/04/2018 1725   LEUKOCYTESUR NEGATIVE 07/04/2018 1725    Creatinine Clearance: Estimated Creatinine Clearance: 67.9 mL/min (by C-G formula based on SCr of 1.1 mg/dL).  Sepsis Labs: @LABRCNTIP (procalcitonin:4,lacticidven:4) )No results found for this or any previous visit (from the past 240 hour(s)).   Radiological Exams on Admission: Ct Chest W Contrast  Result Date: 07/11/2018 CLINICAL DATA:  Weakness, fatigue, weight loss, abdominal pain. Hepatitis. EXAM: CT CHEST, ABDOMEN, AND PELVIS WITH CONTRAST TECHNIQUE: Multidetector CT imaging of the chest, abdomen and pelvis was performed following the standard protocol during bolus administration of intravenous contrast. CONTRAST:  100 cc ISOVUE-300 IOPAMIDOL (ISOVUE-300) INJECTION 61% COMPARISON:  None. FINDINGS: CT CHEST FINDINGS Cardiovascular: Vascular structures are unremarkable. Heart size normal. No pericardial effusion. Mediastinum/Nodes: No pathologically enlarged mediastinal hilar or axillary lymph. Esophagus is grossly unremarkable. Moderate to large hiatal hernia. Lungs/Pleura: Several scattered bilateral pulmonary nodules are seen in a hematogenous distribution, measuring up to 8 x 10 mm in the left lower lobe (series 5, image 91). No pleural fluid. Airway is unremarkable. Musculoskeletal: No worrisome lytic or sclerotic lesions. CT ABDOMEN PELVIS FINDINGS Hepatobiliary: Heterogeneous lesions are seen throughout liver, measuring up to 5.1 x 10.3 cm in the right hepatic lobe. Gallbladder is unremarkable. No biliary ductal dilatation. Pancreas: Negative. Spleen: Negative. Adrenals/Urinary Tract: Adrenal glands are unremarkable. Probable renal sinus cysts bilaterally. Additional  low-attenuation lesions in kidney measure up to 1 5 cm and are likely cysts. Ureters are decompressed. Bladder is grossly unremarkable. Stomach/Bowel: Moderate hiatal hernia. Stomach, small bowel, appendix and colon are unremarkable. Vascular/Lymphatic: Atherosclerotic calcification of the arterial vasculature without abdominal aortic aneurysm. Gastrohepatic ligament lymph nodes measure up to 11 mm. Periportal lymph nodes measure up to 10 mm. Reproductive: Prostate is visualized. Other: Small to moderate pelvic free fluid. Mild stranding and  a small amount of fluid extend inferiorly from the right hepatic lobe along the right paracolic gutter. Musculoskeletal: Small area of sclerosis is seen in the medial right iliac wing, adjacent to the sacroiliac joint nonspecific. Otherwise, no worrisome lytic or sclerotic lesions. IMPRESSION: 1. Pulmonary nodules and heterogeneous lesions throughout the liver are indicative of metastatic disease. 2. Trace fluid and inflammatory stranding extend from the right hepatic lobe along the right paracolic gutter. Small to moderate pelvic ascites. Peritoneal carcinomatosis cannot be excluded. 3. Borderline enlarged gastrohepatic ligament and periportal lymph nodes. 4. Small area of sclerosis in the medial right iliac wing is indeterminate. Continued attention on follow-up exams is warranted as metastatic disease cannot be excluded. 5. Moderate hiatal hernia. 6.  Aortic atherosclerosis (ICD10-170.0). Electronically Signed   By: Lorin Picket M.D.   On: 07/11/2018 15:42   Ct Abdomen Pelvis W Contrast  Result Date: 07/11/2018 CLINICAL DATA:  Weakness, fatigue, weight loss, abdominal pain. Hepatitis. EXAM: CT CHEST, ABDOMEN, AND PELVIS WITH CONTRAST TECHNIQUE: Multidetector CT imaging of the chest, abdomen and pelvis was performed following the standard protocol during bolus administration of intravenous contrast. CONTRAST:  100 cc ISOVUE-300 IOPAMIDOL (ISOVUE-300) INJECTION 61%  COMPARISON:  None. FINDINGS: CT CHEST FINDINGS Cardiovascular: Vascular structures are unremarkable. Heart size normal. No pericardial effusion. Mediastinum/Nodes: No pathologically enlarged mediastinal hilar or axillary lymph. Esophagus is grossly unremarkable. Moderate to large hiatal hernia. Lungs/Pleura: Several scattered bilateral pulmonary nodules are seen in a hematogenous distribution, measuring up to 8 x 10 mm in the left lower lobe (series 5, image 91). No pleural fluid. Airway is unremarkable. Musculoskeletal: No worrisome lytic or sclerotic lesions. CT ABDOMEN PELVIS FINDINGS Hepatobiliary: Heterogeneous lesions are seen throughout liver, measuring up to 5.1 x 10.3 cm in the right hepatic lobe. Gallbladder is unremarkable. No biliary ductal dilatation. Pancreas: Negative. Spleen: Negative. Adrenals/Urinary Tract: Adrenal glands are unremarkable. Probable renal sinus cysts bilaterally. Additional low-attenuation lesions in kidney measure up to 1 5 cm and are likely cysts. Ureters are decompressed. Bladder is grossly unremarkable. Stomach/Bowel: Moderate hiatal hernia. Stomach, small bowel, appendix and colon are unremarkable. Vascular/Lymphatic: Atherosclerotic calcification of the arterial vasculature without abdominal aortic aneurysm. Gastrohepatic ligament lymph nodes measure up to 11 mm. Periportal lymph nodes measure up to 10 mm. Reproductive: Prostate is visualized. Other: Small to moderate pelvic free fluid. Mild stranding and a small amount of fluid extend inferiorly from the right hepatic lobe along the right paracolic gutter. Musculoskeletal: Small area of sclerosis is seen in the medial right iliac wing, adjacent to the sacroiliac joint nonspecific. Otherwise, no worrisome lytic or sclerotic lesions. IMPRESSION: 1. Pulmonary nodules and heterogeneous lesions throughout the liver are indicative of metastatic disease. 2. Trace fluid and inflammatory stranding extend from the right hepatic lobe  along the right paracolic gutter. Small to moderate pelvic ascites. Peritoneal carcinomatosis cannot be excluded. 3. Borderline enlarged gastrohepatic ligament and periportal lymph nodes. 4. Small area of sclerosis in the medial right iliac wing is indeterminate. Continued attention on follow-up exams is warranted as metastatic disease cannot be excluded. 5. Moderate hiatal hernia. 6.  Aortic atherosclerosis (ICD10-170.0). Electronically Signed   By: Lorin Picket M.D.   On: 07/11/2018 15:42    EKG: NSR  Assessment/Plan Active Problems:   * No active hospital problems. *   1] elevated LFTs /weight loss and abdominal pain-patient admitted with unintended weight loss of over 20 pounds in the last 1 month with decreased appetite and generalized weakness.  He was being worked up as  an outpatient by his PCP for elevated liver function test.  However his abdominal pain got worse and he decided to come to the ER.  In the ER work-up shows he has elevated AST ALT and total bilirubin with plan phosphatase.  Will obtain an abdominal ultrasound to rule out any obstruction.  A CT scan of the abdomen and chest and pelvis in the ER shows possible metastatic lesions in the liver and pulmonary nodules and ascites in the pelvis.  At this time the primary remains unknown.  I will schedule IR to do a paracentesis for diagnostic and therapeutic purposes.  If the ascitic fluid comes back negative will need to get tissue biopsy.  Hepatitis panel shows that he is hepatitis C antibody positive with negative RNA.  Patient does have a history of GERD and Barrett's esophagus and has had a liver biopsy in 2003  for elevated LFTs.  He does take Tylenol as needed at home along with aspirin which I will hold both of them.  Pain controlled with Dilaudid.  Consult GI in a.m.    2] hypothyroidism continue Synthroid.  3] GERD continue Prilosec.   DVT prophylaxis: Lovenox Code Status: Full code Family Communication: No family  available Disposition Plan: TBD Admission status: Inpatient   Georgette Shell MD [facility]  If 7PM-7AM, please contact night-coverage [email or web address] Password [facility]  07/11/2018, 4:55 PM

## 2018-07-11 NOTE — ED Notes (Signed)
Pt states he will re-attempt for a urine sample after the pain medicine gives some relief so he can sit up

## 2018-07-11 NOTE — Telephone Encounter (Signed)
Pt has been informed and expressed understanding. He stated that he has still been having issues with not feeling well and tired. He stated the he would be calling EMS because he needs help. He was made aware of this results and would be waiting for call from referral is that option is needed. I also told the patient that if he admitted to the hospital they would be able to see his recent office notes, and lab work to be able to further assist him.

## 2018-07-11 NOTE — Telephone Encounter (Signed)
Copied from [place] [id number]. Topic: General - Other >> Jul 11, 2018  3:47 PM Margot Ables wrote: Reason for CRM: Dr. Melina Copa requesting call back. Pt has abnormal CT and trying to determine if pt needs to be admitted. Ph# [phone]

## 2018-07-11 NOTE — Telephone Encounter (Addendum)
Spoke to Dr. Melina Copa at the ED. He stated that the patient did follow up with going to the ER after our discussion this morning about his lab results. Dr. Melina Copa stated they did a CT scan of the chest and abdomen and found some metastatic findings. He wanted to know if the hospital should admit him for an oncology work up or to send the patient home and let the patient follow up with PCP. I suggested since he expressed concern about the findings on the CT to admit the patient and start the oncology work up process since the patient has been having this pain for a while and it has gotten worse since last week. He agreed and will follow up with the patient about admitting.   Dr. Denice Bors.

## 2018-07-11 NOTE — Progress Notes (Signed)
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 Daughter at bedside

## 2018-07-12 ENCOUNTER — Encounter (HOSPITAL_COMMUNITY): Payer: Self-pay | Admitting: Radiology

## 2018-07-12 ENCOUNTER — Inpatient Hospital Stay (HOSPITAL_COMMUNITY): Payer: BLUE CROSS/BLUE SHIELD

## 2018-07-12 DIAGNOSIS — K769 Liver disease, unspecified: Secondary | ICD-10-CM

## 2018-07-12 DIAGNOSIS — R180 Malignant ascites: Secondary | ICD-10-CM

## 2018-07-12 LAB — HEPATIC FUNCTION PANEL
ALT: 112 U/L — AB (ref 0–44)
AST: 186 U/L — AB (ref 15–41)
Albumin: 2.9 g/dL — ABNORMAL LOW (ref 3.5–5.0)
Alkaline Phosphatase: 391 U/L — ABNORMAL HIGH (ref 38–126)
BILIRUBIN DIRECT: 1.9 mg/dL — AB (ref 0.0–0.2)
BILIRUBIN INDIRECT: 1.3 mg/dL — AB (ref 0.3–0.9)
Total Bilirubin: 3.2 mg/dL — ABNORMAL HIGH (ref 0.3–1.2)
Total Protein: 6.6 g/dL (ref 6.5–8.1)

## 2018-07-12 LAB — CBC
HEMATOCRIT: 33.5 % — AB (ref 39.0–52.0)
Hemoglobin: 10.9 g/dL — ABNORMAL LOW (ref 13.0–17.0)
MCH: 27.8 pg (ref 26.0–34.0)
MCHC: 32.5 g/dL (ref 30.0–36.0)
MCV: 85.5 fL (ref 78.0–100.0)
Platelets: 392 10*3/uL (ref 150–400)
RBC: 3.92 MIL/uL — AB (ref 4.22–5.81)
RDW: 17.4 % — AB (ref 11.5–15.5)
WBC: 7.4 10*3/uL (ref 4.0–10.5)

## 2018-07-12 LAB — BASIC METABOLIC PANEL
Anion gap: 8 (ref 5–15)
BUN: 23 mg/dL (ref 8–23)
CALCIUM: 9 mg/dL (ref 8.9–10.3)
CO2: 26 mmol/L (ref 22–32)
Chloride: 104 mmol/L (ref 98–111)
Creatinine, Ser: 0.63 mg/dL (ref 0.61–1.24)
GFR calc Af Amer: >60 mL/min (ref >60)
GLUCOSE: 105 mg/dL — AB (ref 70–99)
Potassium: 4.5 mmol/L (ref 3.5–5.1)
Sodium: 138 mmol/L (ref 135–145)

## 2018-07-12 LAB — PROTIME-INR
INR: 1.35
Prothrombin Time: 16.6 seconds — ABNORMAL HIGH (ref 11.4–15.2)

## 2018-07-12 MED ORDER — LIDOCAINE HCL 1 % IJ SOLN
INTRAMUSCULAR | Status: AC
  Filled 2018-07-12: qty 20

## 2018-07-12 MED ORDER — MIDAZOLAM HCL 2 MG/2ML IJ SOLN
INTRAMUSCULAR | Status: AC | PRN
  Administered 2018-07-12 (×2): 1 mg via INTRAVENOUS

## 2018-07-12 MED ORDER — MIDAZOLAM HCL 2 MG/2ML IJ SOLN
INTRAMUSCULAR | Status: AC
  Filled 2018-07-12: qty 2

## 2018-07-12 MED ORDER — OXYCODONE HCL 5 MG PO TABS
5.0000 mg | ORAL_TABLET | Freq: Four times a day (QID) | ORAL | Status: DC | PRN
  Administered 2018-07-12 – 2018-07-13 (×3): 5 mg via ORAL
  Filled 2018-07-12 (×3): qty 1

## 2018-07-12 MED ORDER — FENTANYL CITRATE (PF) 100 MCG/2ML IJ SOLN
INTRAMUSCULAR | Status: AC | PRN
  Administered 2018-07-12 (×2): 50 ug via INTRAVENOUS

## 2018-07-12 MED ORDER — OXYCODONE HCL 5 MG PO TABS: 5.0000 mg | ORAL_TABLET | ORAL | 0 refills | Status: DC | PRN

## 2018-07-12 MED ORDER — GELATIN ABSORBABLE 12-7 MM EX MISC
CUTANEOUS | Status: AC
  Filled 2018-07-12: qty 1

## 2018-07-12 MED ORDER — OXYCODONE HCL 5 MG PO TABS: 5.0000 mg | ORAL_TABLET | ORAL | Status: DC | PRN

## 2018-07-12 MED ORDER — FENTANYL CITRATE (PF) 100 MCG/2ML IJ SOLN
INTRAMUSCULAR | Status: AC
  Filled 2018-07-12: qty 2

## 2018-07-12 NOTE — Procedures (Signed)
Liver mets  S/p US LIVER MASS BX  No comp Stable EBL min Path pending Full report in pacs

## 2018-07-12 NOTE — Progress Notes (Signed)
PROGRESS NOTE    John Logan  OIZ:[id number] DOB: 1956-09-09 DOA: 07/11/2018 PCP: Biagio Borg, MD Brief Narrative: 62 y.o. male with medical history significant of Barrett's esophagus, hypo-thyroidism admitted with unintended weight loss of over 20 pounds in the last 1 month.  He works Doppler.  He has generalized weakness and fatigue.  He has no appetite.  Has been having abdominal pain in the upper abdomen more on the right side for few weeks now.  He was being worked up as an outpatient.  He saw his primary doctor a few days ago and saw his liver function test elevated and was ordering an outpatient CT of the chest and abdomen but patient's pain got worse so he decided to come to the ER.  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  He denies any nausea vomiting diarrhea constipation or change in bowel habits.  Denies any hematuria hematochezia cough fever chills or chest pain.  He reports he had a liver biopsy in 2003 for elevated liver enzymes.  That is all he can remember about that liver biopsy does not know the report of the liver biopsy.  He reported that he had some tick bites few weeks ago had not noticed any fever or painful joints.  He complains of generalized weakness but he is able to ambulate no dysphagia dysarthria headaches or changes with his vision. His main complaints at this time is abdominal pain which is better after getting morphine.  ED Course: Patient received IV fluids with D5 half-normal 100 cc an hour, morphine 4 mg, Percocet, and a UA was sent out.  Sodium 137 potassium 4.9 BUN 37 creatinine 1.10 alkaline phosphatase is 540 which is increased from 3 days ago which was 472, lipase 57, AST 266 and ALT 152 which is up from 209 and 106 3 days ago.  Total bilirubin is 4.2 which is up from 2.23 days ago.  White count 8.6 hemoglobin 12.8 platelet count  442.  Hepatitis panel was sent out on 07/07/2018 hepatitis A and B- negative hepatitis C antibody reactive.  HCVRNA not detected.  CT of the abdomen and pelvis showed pulmonary nodules and heterogeneous lesions throughout the liver indicating of metastatic disease, trace fluid and inflammatory stranding extending from the right hepatic lobe along the right paracolic gutter.  Small to moderate pelvic ascites peritoneal carcinomatosis cannot be excluded, borderline enlarged gastrohepatic ligament and periportal lymph nodes, small area of sclerosis in the medial right iliac wing is indeterminate.  Moderate hiatal hernia.     Assessment & Plan:   Active Problems:   Metastatic carcinoma (HCC)   Jaundice   Abnormal LFTs  1]1] elevated LFTs /weight loss and abdominal pain-patient admitted with unintended weight loss of over 20 pounds in the last 1 month with decreased appetite and generalized weakness.  He was being worked up as an outpatient by his PCP for elevated liver function test.  However his abdominal pain got worse and he decided to come to the ER.  In the ER work-up shows he has elevated AST ALT and total bilirubin with plan phosphatase.  Will obtain an abdominal ultrasound to rule out any obstruction.  A CT scan of the abdomen and chest and pelvis in the ER shows possible metastatic lesions in the liver and pulmonary nodules and ascites in the pelvis.  At this time  the primary remains unknown.  Patient is scheduled to have a liver biopsy today.  Discussed with IR.  Patient did not have much acetic fluid in the pelvis to do a paracentesis.Hepatitis panel shows that he is hepatitis C antibody positive with negative RNA.  Patient does have a history of GERD and Barrett's esophagus and has had a liver biopsy in 2003  for elevated LFTs.  He does take Tylenol as needed at home along with aspirin which I will hold both of them.  Pain controlled with Dilaudid  2] hypothyroidism continue Synthroid.  3]  GERD continue Prilosec.     DVT prophylaxis: Lovenox Code Status full code Family Communication discussed with patient and family Disposition Plan: Patient to have liver biopsy 07/12/2018.  Plan to discharge from 07/13/2018 with information about oncology follow-up.  He will need narcotics at the time of discharge for pain control which I have printed and left it in his paper chart at the nurses station.   Consultants: None  Procedures: AWAIT LIVER BIOPSY 7/23 Antimicrobials:NONE  Subjective: Complains of severe abdominal pain requiring pain medications every 4.  Denies any nausea vomiting.   Objective: Vitals:   07/11/18 1857 07/11/18 2105 07/12/18 0521 07/12/18 1421  BP: (!) 141/94 129/85 138/83   Pulse: 82 89 79   Resp: (!) 24 18 18    Temp: 98 F (36.7 C) 98.7 F (37.1 C) 97.9 F (36.6 C)   TempSrc: Oral Oral Oral   SpO2: 100% 96% 97%   Weight:    69.7 kg (153 lb 11.2 oz)  Height:        Intake/Output Summary (Last 24 hours) at 07/12/2018 1423 Last data filed at 07/12/2018 0900 Gross per 24 hour  Intake 2428.33 ml  Output 475 ml  Net 1953.33 ml   Filed Weights   07/12/18 1421  Weight: 69.7 kg (153 lb 11.2 oz)    Examination:  General exam: Appears calm and comfortable  Respiratory system: Clear to auscultation. Respiratory effort normal. Cardiovascular system: S1 & S2 heard, RRR. No JVD, murmurs, rubs, gallops or clicks. No pedal edema. Gastrointestinal system: Abdomen is nondistended, soft and nontender. No organomegaly or masses felt. Normal bowel sounds heard. Central nervous system: Alert and oriented. No focal neurological deficits. Extremities: Symmetric 5 x 5 power. Skin: No rashes, lesions or ulcers Psychiatry: Judgement and insight appear normal. Mood & affect appropriate.     Data Reviewed: I have personally reviewed following labs and imaging studies  CBC: Recent Labs  Lab 07/11/18 1327 07/12/18 0531  WBC 8.6 7.4  NEUTROABS 6.9  --   HGB  12.8* 10.9*  HCT 38.6* 33.5*  MCV 83.4 85.5  PLT 442* 017   Basic Metabolic Panel: Recent Labs  Lab 07/11/18 1327 07/12/18 0531  NA 137 138  K 4.9 4.5  CL 98 104  CO2 25 26  GLUCOSE 133* 105*  BUN 37* 23  CREATININE 1.10 0.63  CALCIUM 10.0 9.0   GFR: Estimated Creatinine Clearance: 92.6 mL/min (by C-G formula based on SCr of 0.63 mg/dL). Liver Function Tests: Recent Labs  Lab 07/07/18 0923 07/11/18 1327 07/12/18 0531  AST 209* 266* 186*  ALT 106* 152* 112*  ALKPHOS 472* 540* 391*  BILITOT 2.2* 4.2* 3.2*  PROT 7.7 8.3* 6.6  ALBUMIN 3.9 3.6 2.9*   Recent Labs  Lab 07/11/18 1327  LIPASE 57*   No results for input(s): AMMONIA in the last 168 hours. Coagulation Profile: Recent Labs  Lab 07/12/18 0531  INR 1.35  Cardiac Enzymes: No results for input(s): CKTOTAL, CKMB, CKMBINDEX, TROPONINI in the last 168 hours. BNP (last 3 results) No results for input(s): PROBNP in the last 8760 hours. HbA1C: No results for input(s): HGBA1C in the last 72 hours. CBG: No results for input(s): GLUCAP in the last 168 hours. Lipid Profile: No results for input(s): CHOL, HDL, LDLCALC, TRIG, CHOLHDL, LDLDIRECT in the last 72 hours. Thyroid Function Tests: No results for input(s): TSH, T4TOTAL, FREET4, T3FREE, THYROIDAB in the last 72 hours. Anemia Panel: No results for input(s): VITAMINB12, FOLATE, FERRITIN, TIBC, IRON, RETICCTPCT in the last 72 hours. Sepsis Labs: No results for input(s): PROCALCITON, LATICACIDVEN in the last 168 hours.  No results found for this or any previous visit (from the past 240 hour(s)).       Radiology Studies: Ct Chest W Contrast  Result Date: 07/11/2018 CLINICAL DATA:  Weakness, fatigue, weight loss, abdominal pain. Hepatitis. EXAM: CT CHEST, ABDOMEN, AND PELVIS WITH CONTRAST TECHNIQUE: Multidetector CT imaging of the chest, abdomen and pelvis was performed following the standard protocol during bolus administration of intravenous contrast.  CONTRAST:  100 cc ISOVUE-300 IOPAMIDOL (ISOVUE-300) INJECTION 61% COMPARISON:  None. FINDINGS: CT CHEST FINDINGS Cardiovascular: Vascular structures are unremarkable. Heart size normal. No pericardial effusion. Mediastinum/Nodes: No pathologically enlarged mediastinal hilar or axillary lymph. Esophagus is grossly unremarkable. Moderate to large hiatal hernia. Lungs/Pleura: Several scattered bilateral pulmonary nodules are seen in a hematogenous distribution, measuring up to 8 x 10 mm in the left lower lobe (series 5, image 91). No pleural fluid. Airway is unremarkable. Musculoskeletal: No worrisome lytic or sclerotic lesions. CT ABDOMEN PELVIS FINDINGS Hepatobiliary: Heterogeneous lesions are seen throughout liver, measuring up to 5.1 x 10.3 cm in the right hepatic lobe. Gallbladder is unremarkable. No biliary ductal dilatation. Pancreas: Negative. Spleen: Negative. Adrenals/Urinary Tract: Adrenal glands are unremarkable. Probable renal sinus cysts bilaterally. Additional low-attenuation lesions in kidney measure up to 1 5 cm and are likely cysts. Ureters are decompressed. Bladder is grossly unremarkable. Stomach/Bowel: Moderate hiatal hernia. Stomach, small bowel, appendix and colon are unremarkable. Vascular/Lymphatic: Atherosclerotic calcification of the arterial vasculature without abdominal aortic aneurysm. Gastrohepatic ligament lymph nodes measure up to 11 mm. Periportal lymph nodes measure up to 10 mm. Reproductive: Prostate is visualized. Other: Small to moderate pelvic free fluid. Mild stranding and a small amount of fluid extend inferiorly from the right hepatic lobe along the right paracolic gutter. Musculoskeletal: Small area of sclerosis is seen in the medial right iliac wing, adjacent to the sacroiliac joint nonspecific. Otherwise, no worrisome lytic or sclerotic lesions. IMPRESSION: 1. Pulmonary nodules and heterogeneous lesions throughout the liver are indicative of metastatic disease. 2. Trace  fluid and inflammatory stranding extend from the right hepatic lobe along the right paracolic gutter. Small to moderate pelvic ascites. Peritoneal carcinomatosis cannot be excluded. 3. Borderline enlarged gastrohepatic ligament and periportal lymph nodes. 4. Small area of sclerosis in the medial right iliac wing is indeterminate. Continued attention on follow-up exams is warranted as metastatic disease cannot be excluded. 5. Moderate hiatal hernia. 6.  Aortic atherosclerosis (ICD10-170.0). Electronically Signed   By: Lorin Picket M.D.   On: 07/11/2018 15:42   Ct Abdomen Pelvis W Contrast  Result Date: 07/11/2018 CLINICAL DATA:  Weakness, fatigue, weight loss, abdominal pain. Hepatitis. EXAM: CT CHEST, ABDOMEN, AND PELVIS WITH CONTRAST TECHNIQUE: Multidetector CT imaging of the chest, abdomen and pelvis was performed following the standard protocol during bolus administration of intravenous contrast. CONTRAST:  100 cc ISOVUE-300 IOPAMIDOL (ISOVUE-300) INJECTION 61% COMPARISON:  None. FINDINGS: CT CHEST FINDINGS Cardiovascular: Vascular structures are unremarkable. Heart size normal. No pericardial effusion. Mediastinum/Nodes: No pathologically enlarged mediastinal hilar or axillary lymph. Esophagus is grossly unremarkable. Moderate to large hiatal hernia. Lungs/Pleura: Several scattered bilateral pulmonary nodules are seen in a hematogenous distribution, measuring up to 8 x 10 mm in the left lower lobe (series 5, image 91). No pleural fluid. Airway is unremarkable. Musculoskeletal: No worrisome lytic or sclerotic lesions. CT ABDOMEN PELVIS FINDINGS Hepatobiliary: Heterogeneous lesions are seen throughout liver, measuring up to 5.1 x 10.3 cm in the right hepatic lobe. Gallbladder is unremarkable. No biliary ductal dilatation. Pancreas: Negative. Spleen: Negative. Adrenals/Urinary Tract: Adrenal glands are unremarkable. Probable renal sinus cysts bilaterally. Additional low-attenuation lesions in kidney measure  up to 1 5 cm and are likely cysts. Ureters are decompressed. Bladder is grossly unremarkable. Stomach/Bowel: Moderate hiatal hernia. Stomach, small bowel, appendix and colon are unremarkable. Vascular/Lymphatic: Atherosclerotic calcification of the arterial vasculature without abdominal aortic aneurysm. Gastrohepatic ligament lymph nodes measure up to 11 mm. Periportal lymph nodes measure up to 10 mm. Reproductive: Prostate is visualized. Other: Small to moderate pelvic free fluid. Mild stranding and a small amount of fluid extend inferiorly from the right hepatic lobe along the right paracolic gutter. Musculoskeletal: Small area of sclerosis is seen in the medial right iliac wing, adjacent to the sacroiliac joint nonspecific. Otherwise, no worrisome lytic or sclerotic lesions. IMPRESSION: 1. Pulmonary nodules and heterogeneous lesions throughout the liver are indicative of metastatic disease. 2. Trace fluid and inflammatory stranding extend from the right hepatic lobe along the right paracolic gutter. Small to moderate pelvic ascites. Peritoneal carcinomatosis cannot be excluded. 3. Borderline enlarged gastrohepatic ligament and periportal lymph nodes. 4. Small area of sclerosis in the medial right iliac wing is indeterminate. Continued attention on follow-up exams is warranted as metastatic disease cannot be excluded. 5. Moderate hiatal hernia. 6.  Aortic atherosclerosis (ICD10-170.0). Electronically Signed   By: Lorin Picket M.D.   On: 07/11/2018 15:42        Scheduled Meds: . feeding supplement (ENSURE ENLIVE)  237 mL Oral BID BM   Continuous Infusions: . sodium chloride 100 mL/hr at 07/12/18 0444     LOS: 1 day     Georgette Shell, MD [facility]  If 7PM-7AM, please contact night-coverage [email or web address] Password [facility] 07/12/2018, 2:23 PM

## 2018-07-12 NOTE — Progress Notes (Signed)
Chief Complaint: Patient was seen in consultation today for hepatic lesions at the request of Dr. Landis Gandy  Referring Physician(s): Dr. Landis Gandy  Supervising Physician: Daryll Brod  Patient Status: [facility] - In-pt  History of Present Illness: John Logan is a 62 y.o. male admitted with fatigue, weight loss, and abd discomfort. He is found to have both pulmonary and hepatic lesions suspicious for metastatic process. Initially IR was requested to perform paracentesis of ascites. However, after review of CT, there is limited ascites in the pelvis, not amenable to para. The numerous lesions in the liver are amenable to biopsy and therefore US guided liver lesion biopsy is requested. PMHx, meds, labs, imaging, allergies reviewed. Has been NPO today as directed. No family at bedside.   Past Medical History:  Diagnosis Date  . Barrett esophagus 07/04/2018  . GERD (gastroesophageal reflux disease)     History reviewed. No pertinent surgical history.  Allergies: Patient has no known allergies.  Medications:  Current Facility-Administered Medications:  .  0.9 %  sodium chloride infusion, , Intravenous, Continuous, Georgette Shell, MD, Last Rate: 100 mL/hr at 07/12/18 0444 .  feeding supplement (ENSURE ENLIVE) (ENSURE ENLIVE) liquid 237 mL, 237 mL, Oral, BID BM, Georgette Shell, MD .  HYDROmorphone (DILAUDID) injection 0.5 mg, 0.5 mg, Intravenous, Q3H PRN, Georgette Shell, MD, 0.5 mg at 07/12/18 0805 .  oxyCODONE-acetaminophen (PERCOCET/ROXICET) 5-325 MG per tablet 1-2 tablet, 1-2 tablet, Oral, Q6H PRN, Hayden Rasmussen, MD, 1 tablet at 07/12/18 1000    Family History  Problem Relation Age of Onset  . Lung cancer Mother   . Esophageal cancer Father     Social History   Socioeconomic History  . Marital status: Married    Spouse name: Not on file  . Number of children: Not on file  . Years of education: Not on file  . Highest education  level: Not on file  Occupational History  . Not on file  Social Needs  . Financial resource strain: Not on file  . Food insecurity:    Worry: Not on file    Inability: Not on file  . Transportation needs:    Medical: Not on file    Non-medical: Not on file  Tobacco Use  . Smoking status: Former Research scientist (life sciences)  . Smokeless tobacco: Never Used  Substance and Sexual Activity  . Alcohol use: Not Currently  . Drug use: Never  . Sexual activity: Yes    Partners: Female  Lifestyle  . Physical activity:    Days per week: Not on file    Minutes per session: Not on file  . Stress: Not on file  Relationships  . Social connections:    Talks on phone: Not on file    Gets together: Not on file    Attends religious service: Not on file    Active member of club or organization: Not on file    Attends meetings of clubs or organizations: Not on file    Relationship status: Not on file  Other Topics Concern  . Not on file  Social History Narrative  . Not on file    Review of Systems: A 12 point ROS discussed and pertinent positives are indicated in the HPI above.  All other systems are negative.  Review of Systems  Vital Signs: BP 138/83 (BP Location: Right Arm)   Pulse 79   Temp 97.9 F (36.6 C) (Oral)   Resp 18   Ht 5\' 8"  (1.727  m)   SpO2 97%   BMI 23.11 kg/m   Physical Exam  Constitutional: He is oriented to person, place, and time. He appears well-developed. No distress.  HENT:  Head: Normocephalic.  Mouth/Throat: Oropharynx is clear and moist.  Neck: Normal range of motion. No JVD present.  Cardiovascular: Normal rate, regular rhythm and normal heart sounds.  Pulmonary/Chest: Effort normal and breath sounds normal.  Abdominal: Soft. There is no tenderness.  Neurological: He is alert and oriented to person, place, and time.  Skin: Skin is warm and dry.  Psychiatric: He has a normal mood and affect.      Imaging: Ct Chest W Contrast  Result Date: 07/11/2018 CLINICAL  DATA:  Weakness, fatigue, weight loss, abdominal pain. Hepatitis. EXAM: CT CHEST, ABDOMEN, AND PELVIS WITH CONTRAST TECHNIQUE: Multidetector CT imaging of the chest, abdomen and pelvis was performed following the standard protocol during bolus administration of intravenous contrast. CONTRAST:  100 cc ISOVUE-300 IOPAMIDOL (ISOVUE-300) INJECTION 61% COMPARISON:  None. FINDINGS: CT CHEST FINDINGS Cardiovascular: Vascular structures are unremarkable. Heart size normal. No pericardial effusion. Mediastinum/Nodes: No pathologically enlarged mediastinal hilar or axillary lymph. Esophagus is grossly unremarkable. Moderate to large hiatal hernia. Lungs/Pleura: Several scattered bilateral pulmonary nodules are seen in a hematogenous distribution, measuring up to 8 x 10 mm in the left lower lobe (series 5, image 91). No pleural fluid. Airway is unremarkable. Musculoskeletal: No worrisome lytic or sclerotic lesions. CT ABDOMEN PELVIS FINDINGS Hepatobiliary: Heterogeneous lesions are seen throughout liver, measuring up to 5.1 x 10.3 cm in the right hepatic lobe. Gallbladder is unremarkable. No biliary ductal dilatation. Pancreas: Negative. Spleen: Negative. Adrenals/Urinary Tract: Adrenal glands are unremarkable. Probable renal sinus cysts bilaterally. Additional low-attenuation lesions in kidney measure up to 1 5 cm and are likely cysts. Ureters are decompressed. Bladder is grossly unremarkable. Stomach/Bowel: Moderate hiatal hernia. Stomach, small bowel, appendix and colon are unremarkable. Vascular/Lymphatic: Atherosclerotic calcification of the arterial vasculature without abdominal aortic aneurysm. Gastrohepatic ligament lymph nodes measure up to 11 mm. Periportal lymph nodes measure up to 10 mm. Reproductive: Prostate is visualized. Other: Small to moderate pelvic free fluid. Mild stranding and a small amount of fluid extend inferiorly from the right hepatic lobe along the right paracolic gutter. Musculoskeletal: Small  area of sclerosis is seen in the medial right iliac wing, adjacent to the sacroiliac joint nonspecific. Otherwise, no worrisome lytic or sclerotic lesions. IMPRESSION: 1. Pulmonary nodules and heterogeneous lesions throughout the liver are indicative of metastatic disease. 2. Trace fluid and inflammatory stranding extend from the right hepatic lobe along the right paracolic gutter. Small to moderate pelvic ascites. Peritoneal carcinomatosis cannot be excluded. 3. Borderline enlarged gastrohepatic ligament and periportal lymph nodes. 4. Small area of sclerosis in the medial right iliac wing is indeterminate. Continued attention on follow-up exams is warranted as metastatic disease cannot be excluded. 5. Moderate hiatal hernia. 6.  Aortic atherosclerosis (ICD10-170.0). Electronically Signed   By: Lorin Picket M.D.   On: 07/11/2018 15:42   Ct Abdomen Pelvis W Contrast  Result Date: 07/11/2018 CLINICAL DATA:  Weakness, fatigue, weight loss, abdominal pain. Hepatitis. EXAM: CT CHEST, ABDOMEN, AND PELVIS WITH CONTRAST TECHNIQUE: Multidetector CT imaging of the chest, abdomen and pelvis was performed following the standard protocol during bolus administration of intravenous contrast. CONTRAST:  100 cc ISOVUE-300 IOPAMIDOL (ISOVUE-300) INJECTION 61% COMPARISON:  None. FINDINGS: CT CHEST FINDINGS Cardiovascular: Vascular structures are unremarkable. Heart size normal. No pericardial effusion. Mediastinum/Nodes: No pathologically enlarged mediastinal hilar or axillary lymph. Esophagus is grossly unremarkable.  Moderate to large hiatal hernia. Lungs/Pleura: Several scattered bilateral pulmonary nodules are seen in a hematogenous distribution, measuring up to 8 x 10 mm in the left lower lobe (series 5, image 91). No pleural fluid. Airway is unremarkable. Musculoskeletal: No worrisome lytic or sclerotic lesions. CT ABDOMEN PELVIS FINDINGS Hepatobiliary: Heterogeneous lesions are seen throughout liver, measuring up to 5.1 x  10.3 cm in the right hepatic lobe. Gallbladder is unremarkable. No biliary ductal dilatation. Pancreas: Negative. Spleen: Negative. Adrenals/Urinary Tract: Adrenal glands are unremarkable. Probable renal sinus cysts bilaterally. Additional low-attenuation lesions in kidney measure up to 1 5 cm and are likely cysts. Ureters are decompressed. Bladder is grossly unremarkable. Stomach/Bowel: Moderate hiatal hernia. Stomach, small bowel, appendix and colon are unremarkable. Vascular/Lymphatic: Atherosclerotic calcification of the arterial vasculature without abdominal aortic aneurysm. Gastrohepatic ligament lymph nodes measure up to 11 mm. Periportal lymph nodes measure up to 10 mm. Reproductive: Prostate is visualized. Other: Small to moderate pelvic free fluid. Mild stranding and a small amount of fluid extend inferiorly from the right hepatic lobe along the right paracolic gutter. Musculoskeletal: Small area of sclerosis is seen in the medial right iliac wing, adjacent to the sacroiliac joint nonspecific. Otherwise, no worrisome lytic or sclerotic lesions. IMPRESSION: 1. Pulmonary nodules and heterogeneous lesions throughout the liver are indicative of metastatic disease. 2. Trace fluid and inflammatory stranding extend from the right hepatic lobe along the right paracolic gutter. Small to moderate pelvic ascites. Peritoneal carcinomatosis cannot be excluded. 3. Borderline enlarged gastrohepatic ligament and periportal lymph nodes. 4. Small area of sclerosis in the medial right iliac wing is indeterminate. Continued attention on follow-up exams is warranted as metastatic disease cannot be excluded. 5. Moderate hiatal hernia. 6.  Aortic atherosclerosis (ICD10-170.0). Electronically Signed   By: Lorin Picket M.D.   On: 07/11/2018 15:42    Labs:  CBC: Recent Labs    07/04/18 1725 07/11/18 1327 07/12/18 0531  WBC 8.4 8.6 7.4  HGB 13.5 12.8* 10.9*  HCT 40.4 38.6* 33.5*  PLT 368.0 442* 392     COAGS: Recent Labs    07/12/18 0531  INR 1.35    BMP: Recent Labs    07/04/18 1725 07/11/18 1327 07/12/18 0531  NA 137 137 138  K 4.6 4.9 4.5  CL 97 98 104  CO2 24 25 26   GLUCOSE 175* 133* 105*  BUN 22 37* 23  CALCIUM 9.8 10.0 9.0  CREATININE 1.16 1.10 0.63  GFRNONAA  --  >60 >60  GFRAA  --  >60 >60    LIVER FUNCTION TESTS: Recent Labs    07/04/18 1725 07/07/18 0923 07/11/18 1327 07/12/18 0531  BILITOT 2.0* 2.2* 4.2* 3.2*  AST 159* 209* 266* 186*  ALT 109* 106* 152* 112*  ALKPHOS 427* 472* 540* 391*  PROT 7.2 7.7 8.3* 6.6  ALBUMIN 4.1 3.9 3.6 2.9*    TUMOR MARKERS: No results for input(s): AFPTM, CEA, CA199, CHROMGRNA in the last 8760 hours.  Assessment and Plan: Lung and liver lesions For US guided biopsy of liver lesion Labs ok Risks and benefits discussed with the patient including, but not limited to bleeding, infection, damage to adjacent structures or low yield requiring additional tests.  All of the patient's questions were answered, patient is agreeable to proceed. Consent signed and in chart.    Thank you for this interesting consult.  I greatly enjoyed meeting RYAN OGBORN and look forward to participating in their care.  A copy of this report was sent to the requesting  provider on this date.  Electronically Signed: Ascencion Dike, PA-C 07/12/2018, 10:53 AM   I spent a total of 20 minutes in face to face in clinical consultation, greater than 50% of which was counseling/coordinating care for liver biopsy

## 2018-07-12 NOTE — Progress Notes (Signed)
Initial Nutrition Assessment  DOCUMENTATION CODES:   Non-severe (moderate) malnutrition in context of acute illness/injury  INTERVENTION:    Monitor for diet advancement/toleration  Ensure Enlive po BID, each supplement provides 350 kcal and 20 grams of protein  NUTRITION DIAGNOSIS:   Moderate Malnutrition related to acute illness(ascites/possible lesions liver/pulmonary nodules) as evidenced by energy intake < 75% for > or equal to 1 month, moderate muscle depletion.  GOAL:   Patient will meet greater than or equal to 90% of their needs  MONITOR:   PO intake, Supplement acceptance, Diet advancement, Weight trends, Labs  REASON FOR ASSESSMENT:   Malnutrition Screening Tool    ASSESSMENT:   Patient with PMH significant for GERD and Barrett esophagus. Presents this admission with elevated LFTs, weight loss, and abdominal pain. A CT scan reveals possible metastatic lesions in the liver/pulomnary nodules and ascites.    Pt reports he noticed a decrease in PO intake starting four weeks ago. His typically day consists of: breakfast- egg/sausage biscuit, apple pie; lunch stromboli, chicken salad; dinner- chinese food, spaghetti, sesame chicken, pecan pie. During this four week time period he would eat his biscuit in the morning, a salad for lunch, and would be too tired to eat when he got home from work. He takes care of his wife and they were going to start Equate shakes this week. Discussed the importance of protein intake for preservation of lean body mass. Pt is currently NPO for biopsy. Will provide supplementation once advanced.   Pt endorses a UBW of 163-168 lb, the last time being at that weight 6 weeks ago at his PCP. Records are limited in wt history making it hard to determine actual wt loss. Nutrition-Focused physical exam completed.   Medications reviewed and include: NS @ 100 ml/hr Labs reviewed: AST 186 (H) ALT 112 (H)   NUTRITION - FOCUSED PHYSICAL EXAM:    Most  Recent Value  Orbital Region  No depletion  Upper Arm Region  Mild depletion  Thoracic and Lumbar Region  Unable to assess  Buccal Region  No depletion  Temple Region  Mild depletion  Clavicle Bone Region  Mild depletion  Clavicle and Acromion Bone Region  Mild depletion  Scapular Bone Region  Unable to assess  Dorsal Hand  Mild depletion  Patellar Region  Moderate depletion  Anterior Thigh Region  Moderate depletion  Posterior Calf Region  Moderate depletion  Edema (RD Assessment)  None  Hair  Reviewed  Eyes  Reviewed  Mouth  Reviewed  Skin  Reviewed  Nails  Reviewed     Diet Order:   Diet Order           Diet NPO time specified  Diet effective midnight          EDUCATION NEEDS:   Education needs have been addressed  Skin:  Skin Assessment: Reviewed RN Assessment  Last BM:  07/12/18  Height:   Ht Readings from Last 1 Encounters:  07/11/18 5\' 8"  (1.727 m)    Weight:   Wt Readings from Last 1 Encounters:  07/12/18 153 lb 11.2 oz (69.7 kg)    Ideal Body Weight:  70 kg  BMI:  Body mass index is 23.37 kg/m.  Estimated Nutritional Needs:   Kcal:  2100-2300 kcal  Protein:  105-115 grams  Fluid:  >/= 2.1 L/day    Mariana Single RD, LDN Clinical Nutrition Pager # [phone]

## 2018-07-13 ENCOUNTER — Telehealth: Payer: Self-pay

## 2018-07-13 ENCOUNTER — Other Ambulatory Visit: Payer: BLUE CROSS/BLUE SHIELD

## 2018-07-13 ENCOUNTER — Inpatient Hospital Stay: Admission: RE | Admit: 2018-07-13 | Payer: BLUE CROSS/BLUE SHIELD | Source: Ambulatory Visit

## 2018-07-13 DIAGNOSIS — E440 Moderate protein-calorie malnutrition: Secondary | ICD-10-CM

## 2018-07-13 DIAGNOSIS — R945 Abnormal results of liver function studies: Secondary | ICD-10-CM

## 2018-07-13 LAB — HEPATIC FUNCTION PANEL
ALT: 103 U/L — AB (ref 0–44)
AST: 212 U/L — AB (ref 15–41)
Albumin: 2.6 g/dL — ABNORMAL LOW (ref 3.5–5.0)
Alkaline Phosphatase: 327 U/L — ABNORMAL HIGH (ref 38–126)
Bilirubin, Direct: 1.7 mg/dL — ABNORMAL HIGH (ref 0.0–0.2)
Indirect Bilirubin: 1.4 mg/dL — ABNORMAL HIGH (ref 0.3–0.9)
TOTAL PROTEIN: 6.1 g/dL — AB (ref 6.5–8.1)
Total Bilirubin: 3.1 mg/dL — ABNORMAL HIGH (ref 0.3–1.2)

## 2018-07-13 LAB — BASIC METABOLIC PANEL
ANION GAP: 10 (ref 5–15)
BUN: 16 mg/dL (ref 8–23)
CO2: 23 mmol/L (ref 22–32)
Calcium: 8.5 mg/dL — ABNORMAL LOW (ref 8.9–10.3)
Chloride: 103 mmol/L (ref 98–111)
Creatinine, Ser: 0.73 mg/dL (ref 0.61–1.24)
GFR calc Af Amer: >60 mL/min (ref >60)
Glucose, Bld: 96 mg/dL (ref 70–99)
Potassium: 4.2 mmol/L (ref 3.5–5.1)
Sodium: 136 mmol/L (ref 135–145)

## 2018-07-13 NOTE — Discharge Summary (Addendum)
Physician Discharge Summary  John Logan RCV:[id number] DOB: October 02, 1956 DOA: 07/11/2018  PCP: Biagio Borg, MD  Admit date: 07/11/2018 Discharge date: 07/13/2018  Admitted From: Home Disposition: Home  Recommendations for Outpatient Follow-up:  1. Follow up with PCP in 1-2 weeks 2. Please obtain CMP/CBC in one week 3. Please follow up on the following pending results: Biopsy of liver  Home Health: No Equipment/Devices: None  Discharge Condition: Stable CODE STATUS: Full Diet recommendation: Regular  Brief/Interim Summary:  #) Metastatic cancer: Patient was admitted with significant weight loss and generalized weakness and fatigue.  He also had some abdominal pain.  His labs were notable for a mixed hepatocellular and cholestatic pattern of liver injury that was mild.  His imaging showed small to moderate pelvic ascites, multiple pulmonary nodules and heterogeneous lesions throughout the liver concerning for metastatic disease.  On 07/12/2018 he had an ultrasound-guided biopsy of the liver of 1 of these lesions.  He will have outpatient follow-up.  He was discharged with oral oxycodone for pain control.  As his liver function tests were elevated he should have these rechecked as an outpatient.  #) Hyper thyroidism: Patient was continued on levothyroxine 25 mcg daily.  #) Barrett's esophagus: Patient was continued on PPI.  Discharge Diagnoses:  Active Problems:   Metastatic carcinoma (HCC)   Jaundice   Abnormal LFTs   Malignant ascites   Liver lesion   Malnutrition of moderate degree    Discharge Instructions  Discharge Instructions    Call MD for:  difficulty breathing, headache or visual disturbances   Complete by:  As directed    Call MD for:  persistant nausea and vomiting   Complete by:  As directed    Call MD for:  redness, tenderness, or signs of infection (pain, swelling, redness, odor or green/yellow discharge around incision site)   Complete by:  As directed     Call MD for:  severe uncontrolled pain   Complete by:  As directed    Call MD for:  temperature >100.4   Complete by:  As directed    Diet - low sodium heart healthy   Complete by:  As directed    Discharge instructions   Complete by:  As directed    Please follow-up with your primary care doctor in 1 to 2 weeks.  Please follow-up with the oncologist in approximately 1 to 2 weeks to follow-up on your biopsy.   Increase activity slowly   Complete by:  As directed      Allergies as of 07/13/2018   No Known Allergies     Medication List    STOP taking these medications   acetaminophen 500 MG tablet Commonly known as:  TYLENOL   aspirin 81 MG chewable tablet     TAKE these medications   levothyroxine 25 MCG tablet Commonly known as:  SYNTHROID, LEVOTHROID Take 1 tablet (25 mcg total) by mouth daily before breakfast.   multivitamin with minerals Tabs tablet Take 1 tablet by mouth daily.   omeprazole 20 MG tablet Commonly known as:  PRILOSEC OTC Take 20 mg by mouth daily.   oxyCODONE 5 MG immediate release tablet Commonly known as:  ROXICODONE Take 1 tablet (5 mg total) by mouth every 4 (four) hours as needed for up to 7 days.       No Known Allergies  Consultations:  Oncology  Interventional radiology   Procedures/Studies: Ct Chest W Contrast  Result Date: 07/11/2018 CLINICAL DATA:  Weakness, fatigue, weight  loss, abdominal pain. Hepatitis. EXAM: CT CHEST, ABDOMEN, AND PELVIS WITH CONTRAST TECHNIQUE: Multidetector CT imaging of the chest, abdomen and pelvis was performed following the standard protocol during bolus administration of intravenous contrast. CONTRAST:  100 cc ISOVUE-300 IOPAMIDOL (ISOVUE-300) INJECTION 61% COMPARISON:  None. FINDINGS: CT CHEST FINDINGS Cardiovascular: Vascular structures are unremarkable. Heart size normal. No pericardial effusion. Mediastinum/Nodes: No pathologically enlarged mediastinal hilar or axillary lymph. Esophagus is grossly  unremarkable. Moderate to large hiatal hernia. Lungs/Pleura: Several scattered bilateral pulmonary nodules are seen in a hematogenous distribution, measuring up to 8 x 10 mm in the left lower lobe (series 5, image 91). No pleural fluid. Airway is unremarkable. Musculoskeletal: No worrisome lytic or sclerotic lesions. CT ABDOMEN PELVIS FINDINGS Hepatobiliary: Heterogeneous lesions are seen throughout liver, measuring up to 5.1 x 10.3 cm in the right hepatic lobe. Gallbladder is unremarkable. No biliary ductal dilatation. Pancreas: Negative. Spleen: Negative. Adrenals/Urinary Tract: Adrenal glands are unremarkable. Probable renal sinus cysts bilaterally. Additional low-attenuation lesions in kidney measure up to 1 5 cm and are likely cysts. Ureters are decompressed. Bladder is grossly unremarkable. Stomach/Bowel: Moderate hiatal hernia. Stomach, small bowel, appendix and colon are unremarkable. Vascular/Lymphatic: Atherosclerotic calcification of the arterial vasculature without abdominal aortic aneurysm. Gastrohepatic ligament lymph nodes measure up to 11 mm. Periportal lymph nodes measure up to 10 mm. Reproductive: Prostate is visualized. Other: Small to moderate pelvic free fluid. Mild stranding and a small amount of fluid extend inferiorly from the right hepatic lobe along the right paracolic gutter. Musculoskeletal: Small area of sclerosis is seen in the medial right iliac wing, adjacent to the sacroiliac joint nonspecific. Otherwise, no worrisome lytic or sclerotic lesions. IMPRESSION: 1. Pulmonary nodules and heterogeneous lesions throughout the liver are indicative of metastatic disease. 2. Trace fluid and inflammatory stranding extend from the right hepatic lobe along the right paracolic gutter. Small to moderate pelvic ascites. Peritoneal carcinomatosis cannot be excluded. 3. Borderline enlarged gastrohepatic ligament and periportal lymph nodes. 4. Small area of sclerosis in the medial right iliac wing is  indeterminate. Continued attention on follow-up exams is warranted as metastatic disease cannot be excluded. 5. Moderate hiatal hernia. 6.  Aortic atherosclerosis (ICD10-170.0). Electronically Signed   By: Lorin Picket M.D.   On: 07/11/2018 15:42   Ct Abdomen Pelvis W Contrast  Result Date: 07/11/2018 CLINICAL DATA:  Weakness, fatigue, weight loss, abdominal pain. Hepatitis. EXAM: CT CHEST, ABDOMEN, AND PELVIS WITH CONTRAST TECHNIQUE: Multidetector CT imaging of the chest, abdomen and pelvis was performed following the standard protocol during bolus administration of intravenous contrast. CONTRAST:  100 cc ISOVUE-300 IOPAMIDOL (ISOVUE-300) INJECTION 61% COMPARISON:  None. FINDINGS: CT CHEST FINDINGS Cardiovascular: Vascular structures are unremarkable. Heart size normal. No pericardial effusion. Mediastinum/Nodes: No pathologically enlarged mediastinal hilar or axillary lymph. Esophagus is grossly unremarkable. Moderate to large hiatal hernia. Lungs/Pleura: Several scattered bilateral pulmonary nodules are seen in a hematogenous distribution, measuring up to 8 x 10 mm in the left lower lobe (series 5, image 91). No pleural fluid. Airway is unremarkable. Musculoskeletal: No worrisome lytic or sclerotic lesions. CT ABDOMEN PELVIS FINDINGS Hepatobiliary: Heterogeneous lesions are seen throughout liver, measuring up to 5.1 x 10.3 cm in the right hepatic lobe. Gallbladder is unremarkable. No biliary ductal dilatation. Pancreas: Negative. Spleen: Negative. Adrenals/Urinary Tract: Adrenal glands are unremarkable. Probable renal sinus cysts bilaterally. Additional low-attenuation lesions in kidney measure up to 1 5 cm and are likely cysts. Ureters are decompressed. Bladder is grossly unremarkable. Stomach/Bowel: Moderate hiatal hernia. Stomach, small bowel, appendix and colon  are unremarkable. Vascular/Lymphatic: Atherosclerotic calcification of the arterial vasculature without abdominal aortic aneurysm.  Gastrohepatic ligament lymph nodes measure up to 11 mm. Periportal lymph nodes measure up to 10 mm. Reproductive: Prostate is visualized. Other: Small to moderate pelvic free fluid. Mild stranding and a small amount of fluid extend inferiorly from the right hepatic lobe along the right paracolic gutter. Musculoskeletal: Small area of sclerosis is seen in the medial right iliac wing, adjacent to the sacroiliac joint nonspecific. Otherwise, no worrisome lytic or sclerotic lesions. IMPRESSION: 1. Pulmonary nodules and heterogeneous lesions throughout the liver are indicative of metastatic disease. 2. Trace fluid and inflammatory stranding extend from the right hepatic lobe along the right paracolic gutter. Small to moderate pelvic ascites. Peritoneal carcinomatosis cannot be excluded. 3. Borderline enlarged gastrohepatic ligament and periportal lymph nodes. 4. Small area of sclerosis in the medial right iliac wing is indeterminate. Continued attention on follow-up exams is warranted as metastatic disease cannot be excluded. 5. Moderate hiatal hernia. 6.  Aortic atherosclerosis (ICD10-170.0). Electronically Signed   By: Lorin Picket M.D.   On: 07/11/2018 15:42   US Biopsy (liver)  Result Date: 07/12/2018 INDICATION: LIVER METASTASES, UNKNOWN PRIMARY EXAM: ULTRASOUND RIGHT LIVER MASS BIOPSY MEDICATIONS: 1% lidocaine local ANESTHESIA/SEDATION: Moderate (conscious) sedation was employed during this procedure. A total of Versed 2.0 mg and Fentanyl 100 mcg was administered intravenously. Moderate Sedation Time: 15 minutes. The patient's level of consciousness and vital signs were monitored continuously by radiology nursing throughout the procedure under my direct supervision. FLUOROSCOPY TIME:  Fluoroscopy Time: None. COMPLICATIONS: None immediate. PROCEDURE: Informed written consent was obtained from the patient after a thorough discussion of the procedural risks, benefits and alternatives. All questions were  addressed. Maximal Sterile Barrier Technique was utilized including caps, mask, sterile gowns, sterile gloves, sterile drape, hand hygiene and skin antiseptic. A timeout was performed prior to the initiation of the procedure. Previous imaging reviewed. Preliminary ultrasound performed. A solid echogenic lesion in the right inferior liver was localized. Overlying skin marked. Under sterile conditions and local anesthesia, a 17 gauge 6.8 cm access needle was advanced percutaneously into the lesion. Needle position confirmed with ultrasound. Images obtained for documentation. 18 gauge core biopsies obtained and placed in formalin. Needle tract occluded with Gel-Foam. Postprocedure imaging demonstrates no hemorrhage or hematoma. Patient tolerated the biopsy well. IMPRESSION: Successful ultrasound right inferior liver mass 18 gauge core biopsy Electronically Signed   By: Jerilynn Mages.  Shick M.D.   On: 07/12/2018 17:19      Subjective:   Discharge Exam: Vitals:   07/12/18 2029 07/13/18 0518  BP: 120/78 (!) 140/93  Pulse: 91 93  Resp: 18 18  Temp: 99 F (37.2 C) 98.4 F (36.9 C)  SpO2: 95% 95%   Vitals:   07/12/18 1815 07/12/18 1845 07/12/18 2029 07/13/18 0518  BP: (!) 156/88 (!) 147/87 120/78 (!) 140/93  Pulse: (!) 102 (!) 102 91 93  Resp: 20 (!) 22 18 18   Temp: 97.7 F (36.5 C) 98.6 F (37 C) 99 F (37.2 C) 98.4 F (36.9 C)  TempSrc: Oral Oral Oral Oral  SpO2: 95% 96% 95% 95%  Weight:      Height:        General: Pt is alert, awake, not in acute distress Cardiovascular: Regular rate and rhythm, no murmurs Respiratory: CTA bilaterally, no wheezing, no rhonchi Abdominal: Soft, NT, ND, bowel sounds +, biopsy site is clean dry and intact Extremities: no edema    The results of significant diagnostics from this  hospitalization (including imaging, microbiology, ancillary and laboratory) are listed below for reference.     Microbiology: No results found for this or any previous visit (from  the past 240 hour(s)).   Labs: BNP (last 3 results) No results for input(s): BNP in the last 8760 hours. Basic Metabolic Panel: Recent Labs  Lab 07/11/18 1327 07/12/18 0531 07/13/18 0411  NA 137 138 136  K 4.9 4.5 4.2  CL 98 104 103  CO2 25 26 23   GLUCOSE 133* 105* 96  BUN 37* 23 16  CREATININE 1.10 0.63 0.73  CALCIUM 10.0 9.0 8.5*   Liver Function Tests: Recent Labs  Lab 07/07/18 0923 07/11/18 1327 07/12/18 0531 07/13/18 0411  AST 209* 266* 186* 212*  ALT 106* 152* 112* 103*  ALKPHOS 472* 540* 391* 327*  BILITOT 2.2* 4.2* 3.2* 3.1*  PROT 7.7 8.3* 6.6 6.1*  ALBUMIN 3.9 3.6 2.9* 2.6*   Recent Labs  Lab 07/11/18 1327  LIPASE 57*   No results for input(s): AMMONIA in the last 168 hours. CBC: Recent Labs  Lab 07/11/18 1327 07/12/18 0531  WBC 8.6 7.4  NEUTROABS 6.9  --   HGB 12.8* 10.9*  HCT 38.6* 33.5*  MCV 83.4 85.5  PLT 442* 392   Cardiac Enzymes: No results for input(s): CKTOTAL, CKMB, CKMBINDEX, TROPONINI in the last 168 hours. BNP: Invalid input(s): POCBNP CBG: No results for input(s): GLUCAP in the last 168 hours. D-Dimer No results for input(s): DDIMER in the last 72 hours. Hgb A1c No results for input(s): HGBA1C in the last 72 hours. Lipid Profile No results for input(s): CHOL, HDL, LDLCALC, TRIG, CHOLHDL, LDLDIRECT in the last 72 hours. Thyroid function studies No results for input(s): TSH, T4TOTAL, T3FREE, THYROIDAB in the last 72 hours.  Invalid input(s): FREET3 Anemia work up No results for input(s): VITAMINB12, FOLATE, FERRITIN, TIBC, IRON, RETICCTPCT in the last 72 hours. Urinalysis    Component Value Date/Time   COLORURINE AMBER (A) 07/11/2018 1720   APPEARANCEUR CLEAR 07/11/2018 1720   LABSPEC >1.046 (H) 07/11/2018 1720   PHURINE 5.0 07/11/2018 1720   GLUCOSEU NEGATIVE 07/11/2018 1720   GLUCOSEU NEGATIVE 07/04/2018 1725   HGBUR NEGATIVE 07/11/2018 1720   BILIRUBINUR NEGATIVE 07/11/2018 1720   KETONESUR 5 (A) 07/11/2018 1720    PROTEINUR NEGATIVE 07/11/2018 1720   UROBILINOGEN 2.0 (A) 07/04/2018 1725   NITRITE NEGATIVE 07/11/2018 1720   LEUKOCYTESUR NEGATIVE 07/11/2018 1720   Sepsis Labs Invalid input(s): PROCALCITONIN,  WBC,  LACTICIDVEN Microbiology No results found for this or any previous visit (from the past 240 hour(s)).   Time coordinating discharge: Over 30 minutes  SIGNED:   Cristy Folks, MD  [facility] 07/13/2018, 1:09 PM   If 7PM-7AM, please contact night-coverage [email or web address] Password [email or web address]

## 2018-07-13 NOTE — Discharge Instructions (Signed)
Liver Biopsy, Care After °These instructions give you information on caring for yourself after your procedure. Your doctor may also give you more specific instructions. Call your doctor if you have any problems or questions after your procedure. °Follow these instructions at home: °· Rest at home for 1-2 days or as told by your doctor. °· Have someone stay with you for at least 24 hours. °· Do not do these things in the first 24 hours: °? Drive. °? Use machinery. °? Take care of other people. °? Sign legal documents. °? Take a bath or shower. °· There are many different ways to close and cover a cut (incision). For example, a cut can be closed with stitches, skin glue, or adhesive strips. Follow your doctor's instructions on: °? Taking care of your cut. °? Changing and removing your bandage (dressing). °? Removing whatever was used to close your cut. °· Do not drink alcohol in the first week. °· Do not lift more than 5 pounds or play contact sports for the first 2 weeks. °· Take medicines only as told by your doctor. For 1 week, do not take medicine that has aspirin in it or medicines like ibuprofen. °· Get your test results. °Contact a doctor if: °· A cut bleeds and leaves more than just a small spot of blood. °· A cut is red, puffs up (swells), or hurts more than before. °· Fluid or something else comes from a cut. °· A cut smells bad. °· You have a fever or chills. °Get help right away if: °· You have swelling, bloating, or pain in your belly (abdomen). °· You get dizzy or faint. °· You have a rash. °· You feel sick to your stomach (nauseous) or throw up (vomit). °· You have trouble breathing, feel short of breath, or feel faint. °· Your chest hurts. °· You have problems talking or seeing. °· You have trouble balancing or moving your arms or legs. °This information is not intended to replace advice given to you by your health care provider. Make sure you discuss any questions you have with your health care  provider. °Document Released: 09/15/2008 Document Revised: 05/14/2016 Document Reviewed: 02/02/2014 °Elsevier Interactive Patient Education © 2018 Elsevier Inc. ° °

## 2018-07-13 NOTE — Telephone Encounter (Signed)
Noted.   Dr. Denice Bors  Copied from [place] [phone]. Topic: Inquiry >> Jul 13, 2018  8:30 AM Vernona Rieger wrote: Reason for CRM: The CT dept with [facility] called to let [name] know that he did not show up for his appointment today due to him being in the hospital. She also said that he already had the CT while in hospital.

## 2018-07-15 ENCOUNTER — Other Ambulatory Visit: Payer: Self-pay | Admitting: Internal Medicine

## 2018-07-15 NOTE — Telephone Encounter (Signed)
Roxicodone refill Last Refill:07/12/18 # 30 Last OV: 07/04/18 PCP: Cathlean Cower MD Pharmacy:[facility]/ [address]

## 2018-07-15 NOTE — Telephone Encounter (Signed)
Copied from [place] [phone]. Topic: Quick Communication - Rx Refill/Question >> Jul 15, 2018 11:16 AM Keene Breath wrote: Medication: oxyCODONE (ROXICODONE) 5 MG immediate release tablet  Patient called to request a refill for the above medication.  He stated that he will be out of this medication on Saturday and needs it right away.  CB# [phone]  Preferred Pharmacy (with phone number or street name): [place] ([address] [place] [phone] (Phone) [phone] (Fax)

## 2018-07-18 ENCOUNTER — Telehealth: Payer: Self-pay | Admitting: Internal Medicine

## 2018-07-18 MED ORDER — OXYCODONE HCL 5 MG PO TABS: 5.0000 mg | ORAL_TABLET | ORAL | 0 refills | Status: DC | PRN

## 2018-07-18 NOTE — Addendum Note (Signed)
Addended by: Biagio Borg on: 07/18/2018 01:18 PM   Modules accepted: Orders

## 2018-07-18 NOTE — Addendum Note (Signed)
Addended by: Biagio Borg on: 07/18/2018 07:57 AM   Modules accepted: Orders

## 2018-07-18 NOTE — Telephone Encounter (Signed)
You can not faxed "Oxycodone" rx manually. Rx must be sent electronically w/provider finger print.Marland KitchenJohny Chess

## 2018-07-18 NOTE — Telephone Encounter (Signed)
Done hardcopy to [place]  

## 2018-07-18 NOTE — Telephone Encounter (Signed)
Pt called in and stated [facility] didn't have enough pills to cover prescription pt states can the script be transferred to  [id number] [place], [place]. [phone] (Phone) [phone] (Fax)

## 2018-07-18 NOTE — Telephone Encounter (Signed)
Since he wanted a hardcopy, he can take to any pharmacy

## 2018-07-18 NOTE — Telephone Encounter (Signed)
Check [place] registry pt received # 30 which is a five day supply on 07/13/2018 at [facility].Marland KitchenJohny Logan

## 2018-07-18 NOTE — Telephone Encounter (Signed)
Faxed

## 2018-07-18 NOTE — Telephone Encounter (Signed)
Unfortunately due to the opiate laws, we need to confirm the number of dispensed oxycodone 5 mg at the first pharmacy, as then we can figure out the remainder to be sent to them

## 2018-07-18 NOTE — Telephone Encounter (Signed)
Patient called to request that the prescription be sent to another pharmacy because the first pharmacy did not have the full amount needed.  The new [facility] is [facility], [address] [place].  They have the full amount of the prescription.  Patient would like for it to be sent to them.  Patient's CB# [phone].

## 2018-07-18 NOTE — Telephone Encounter (Signed)
Done erx 

## 2018-07-19 ENCOUNTER — Telehealth: Payer: Self-pay | Admitting: Internal Medicine

## 2018-07-19 MED ORDER — OXYCODONE HCL 5 MG PO TABS: 5.0000 mg | ORAL_TABLET | ORAL | 0 refills | Status: AC | PRN

## 2018-07-19 NOTE — Telephone Encounter (Signed)
Spoke to pt, he wants it to go electronically. He stated yesterday he wanted the hardcopy because his daughter could have picked it up but she works today. Please advise.

## 2018-07-19 NOTE — Telephone Encounter (Signed)
Done erx 

## 2018-07-21 ENCOUNTER — Ambulatory Visit: Payer: BLUE CROSS/BLUE SHIELD | Admitting: Nurse Practitioner

## 2018-07-21 ENCOUNTER — Encounter

## 2018-07-22 ENCOUNTER — Telehealth: Payer: Self-pay | Admitting: Internal Medicine

## 2018-07-22 DIAGNOSIS — Z0279 Encounter for issue of other medical certificate: Secondary | ICD-10-CM

## 2018-07-22 NOTE — Telephone Encounter (Signed)
Patients daughter dropped off Short Term Disability forms to be completed for patient.   Forms have been completed & I spoke with patient - He is "Really wore down" he states - He knows he needs to come in for a hospital FU, but he can't even stand to get out of bed. He states he will call next week and make a hospital fu.   I have placed the forms in your box for you to review and sign. & I am unsure how long you want to write him out for. I know he is very sick and he states if he can he would go back but he does know when that would be. Please advise. Thank you.

## 2018-07-22 NOTE — Telephone Encounter (Signed)
For should be completed for dates c/w the maximum allowed (I think likely 90 days), as it is Very unlikely he will return to work, and may not be realistic about this

## 2018-07-25 NOTE — Telephone Encounter (Signed)
Forms have been signed, Copy sent to scan & Faxed to [employer] @ [phone], Patient informed - States daughter will pick up the forms.   Charged for.

## 2018-07-26 ENCOUNTER — Telehealth: Payer: Self-pay | Admitting: Internal Medicine

## 2018-07-26 NOTE — Telephone Encounter (Signed)
Per the 7/24 d/c summary, pt was recommended to return to see me in 1-2 wks  I would still encourage this/needs ROV

## 2018-07-26 NOTE — Telephone Encounter (Signed)
Patient's daughter is calling wanting to know what step needs to be taken next with her dad's conditions. Does he have small cell carcinomal or not? Does he needs to be referred to Oncology, Go back to the ED or come see you? She states her dad is not even getting out of bed much or eating. Please advise.

## 2018-07-27 NOTE — Telephone Encounter (Signed)
Called patient and left message for him to call back to schedule follow up with [name].

## 2018-08-01 ENCOUNTER — Telehealth: Payer: Self-pay | Admitting: Internal Medicine

## 2018-08-01 NOTE — Telephone Encounter (Signed)
Cathie Beams (son in law) came by the office to drop off FMLA paperwork to be completed for the patient.   Minna Merritts would like a call when it is completed [phone].  Placed in Brittany's box for completion.

## 2018-08-08 NOTE — Telephone Encounter (Signed)
Forms have been completed & placed in providers box to review and sign.   They have been filled out for the full 12 weeks allowed and a not made it could be longer.

## 2018-08-09 NOTE — Telephone Encounter (Signed)
Forms have been signed, copy sent to scan.   Original mailed to employer. & Copy mailed to patient.

## 2018-08-12 ENCOUNTER — Telehealth: Payer: Self-pay | Admitting: Internal Medicine

## 2018-08-12 NOTE — Telephone Encounter (Signed)
Has dropped additional FMLA forms to be completed.  I have placed in John Logan box.

## 2018-08-15 MED ORDER — ONDANSETRON HCL 4 MG/2ML IJ SOLN
4.00 | INTRAMUSCULAR | Status: DC
Start: ? — End: 2018-08-15

## 2018-08-15 MED ORDER — POLYETHYLENE GLYCOL 3350 17 G PO PACK
17.00 g | PACK | ORAL | Status: DC
Start: 2018-08-22 — End: 2018-08-15

## 2018-08-15 MED ORDER — MEGESTROL ACETATE 400 MG/10ML PO SUSP
400.00 | ORAL | Status: DC
Start: 2018-08-21 — End: 2018-08-15

## 2018-08-15 MED ORDER — OXYCODONE HCL ER 20 MG PO T12A
20.00 | EXTENDED_RELEASE_TABLET | ORAL | Status: DC
Start: 2018-08-21 — End: 2018-08-15

## 2018-08-15 MED ORDER — HYDROMORPHONE HCL 1 MG/ML IJ SOLN
1.00 | INTRAMUSCULAR | Status: DC
Start: ? — End: 2018-08-15

## 2018-08-15 MED ORDER — OXYCODONE HCL 5 MG PO TABS
10.00 | ORAL_TABLET | ORAL | Status: DC
Start: ? — End: 2018-08-15

## 2018-08-15 MED ORDER — ALLOPURINOL 100 MG PO TABS
100.00 | ORAL_TABLET | ORAL | Status: DC
Start: 2018-08-22 — End: 2018-08-15

## 2018-08-15 MED ORDER — PROCHLORPERAZINE MALEATE 5 MG PO TABS
10.00 | ORAL_TABLET | ORAL | Status: DC
Start: ? — End: 2018-08-15

## 2018-08-15 MED ORDER — SODIUM CHLORIDE 0.9 % IV SOLN
INTRAVENOUS | Status: DC
Start: ? — End: 2018-08-15

## 2018-08-15 MED ORDER — GENERIC EXTERNAL MEDICATION
2.00 g | Status: DC
Start: 2018-08-15 — End: 2018-08-15

## 2018-08-15 NOTE — Telephone Encounter (Signed)
Form has been signed, copy sent to scan & Faxed to [facility] @ [phone].  LVM for daughter to inform her of the above note. Original is ready to be picked up, Can mail if they would like.

## 2018-08-15 NOTE — Telephone Encounter (Signed)
Forms have been completed & OV notes printed from 7/15 - Forms placed in providers box to review and sign.

## 2018-08-16 ENCOUNTER — Telehealth: Payer: Self-pay | Admitting: Internal Medicine

## 2018-08-16 NOTE — Telephone Encounter (Signed)
Copied from [place] [phone]. Topic: Inquiry >> Aug 16, 2018 11:12 AM Oliver Pila B wrote: Reason for CRM: [facility] called to make sure that the pcp was going to be the attendant of record for hospice; contact [phone]

## 2018-08-16 NOTE — Telephone Encounter (Signed)
The ok given to hospice

## 2018-08-19 ENCOUNTER — Encounter: Payer: Self-pay | Admitting: Internal Medicine

## 2018-08-21 MED ORDER — GENERIC EXTERNAL MEDICATION
1.00 | Status: DC
Start: 2018-08-21 — End: 2018-08-21

## 2018-08-21 MED ORDER — BISACODYL 10 MG RE SUPP
10.00 | RECTAL | Status: DC
Start: ? — End: 2018-08-21

## 2018-08-21 MED ORDER — AZITHROMYCIN 250 MG PO TABS
500.00 | ORAL_TABLET | ORAL | Status: DC
Start: 2018-08-22 — End: 2018-08-21

## 2018-08-21 MED ORDER — AMOXICILLIN-POT CLAVULANATE 875-125 MG PO TABS
875.00 | ORAL_TABLET | ORAL | Status: DC
Start: 2018-08-21 — End: 2018-08-21

## 2018-09-01 ENCOUNTER — Telehealth: Payer: Self-pay

## 2018-09-01 NOTE — Telephone Encounter (Signed)
On 09/01/18 I received a d/c from [facility] (original). The d/c is for burial.  The patient is a patient of Doctor Cathlean Cower.  The d/c will be taken to [facility] [place] for signature.

## 2018-09-02 NOTE — Telephone Encounter (Signed)
09/02/18 Received DC back from Dr. Billie Ruddy called [facility] to pick up. PWR

## 2018-09-15 ENCOUNTER — Telehealth: Payer: Self-pay | Admitting: Internal Medicine

## 2018-09-15 NOTE — Telephone Encounter (Signed)
I dont currently have any paperwork on my desk, so either it has been signed, or perhaps it was not received. thanks

## 2018-09-15 NOTE — Telephone Encounter (Signed)
Rechecked pt record. Was scan in system on 2018/09/12. Reprinted POC forms and faxed to [phone].Marland KitchenJohny Chess

## 2018-09-15 NOTE — Telephone Encounter (Addendum)
Copied from [place] [id number]. Topic: General - Other >> Sep 15, 2018 11:04 AM Lennox Solders wrote: Reason for CRM: shavon from [facility] is calling checking on the status of plan of care and also CTI . Shavon mail to office on 08-26-18

## 2018-09-20 DEATH — deceased

## 2019-08-27 IMAGING — CT CT CHEST W/ CM
2 of 5 series · 13 of 36 positions shown, 16 images · IV contrast (ISOVUE)
Comparison: None.

CLINICAL DATA: Weakness, fatigue, weight loss, abdominal pain.
Hepatitis.

EXAM:
CT CHEST, ABDOMEN, AND PELVIS WITH CONTRAST
TECHNIQUE: Multidetector CT imaging of the chest, abdomen and pelvis was
performed following the standard protocol during bolus
administration of intravenous contrast.
CONTRAST:  100 cc [id number] IOPAMIDOL ([id number]) INJECTION 61%

[Series 3: cap with · axial · 0.71mm/px · z∈[-445,+95]mm · 10 of 133 slices shown, 13 images]
[im 13/133  mediastinal]
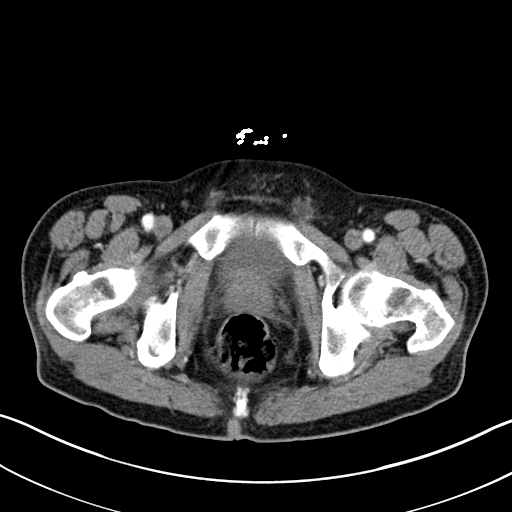
[im 13/133  lung]
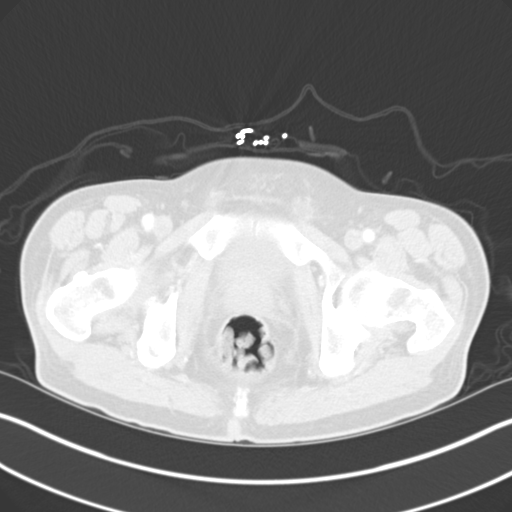
[im 25/133  lung]
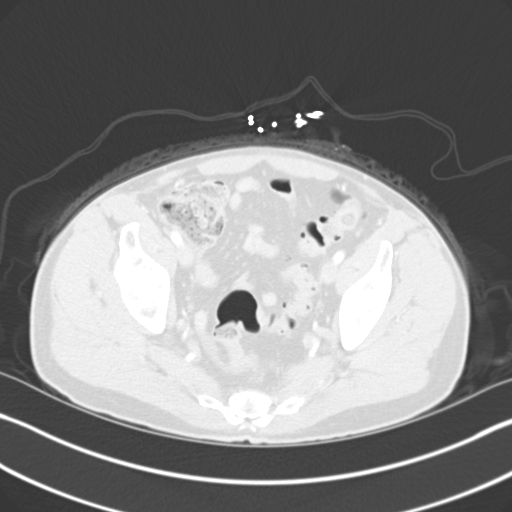
[im 37/133  lung]
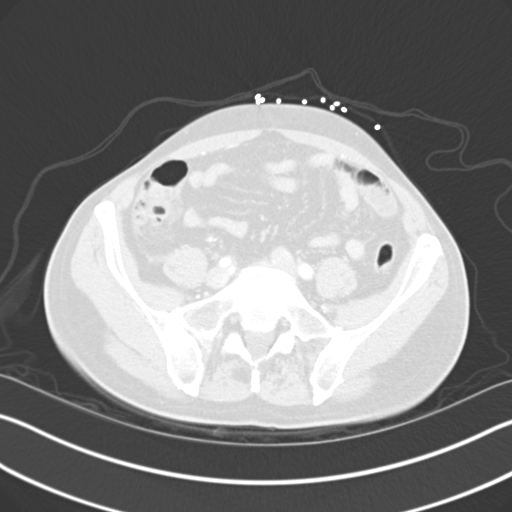
[im 49/133  lung]
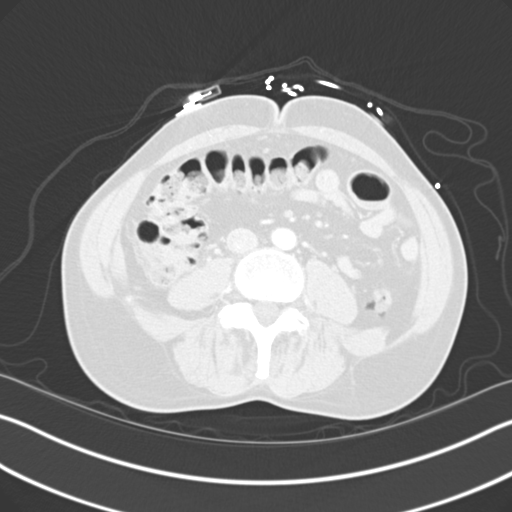
[im 61/133  mediastinal]
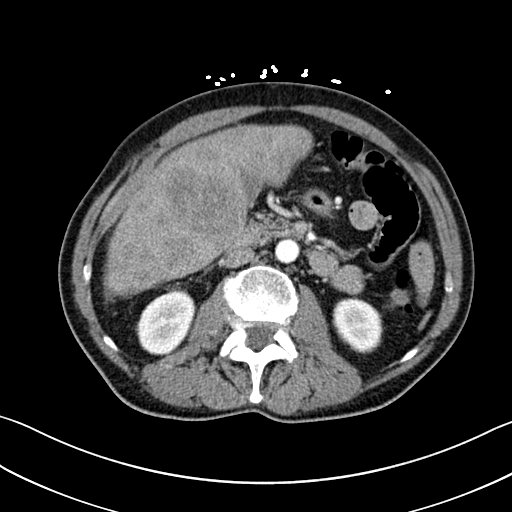
[im 61/133  lung]
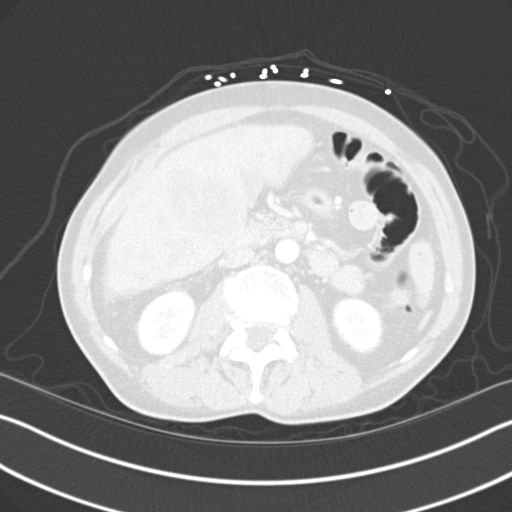
[im 73/133  lung]
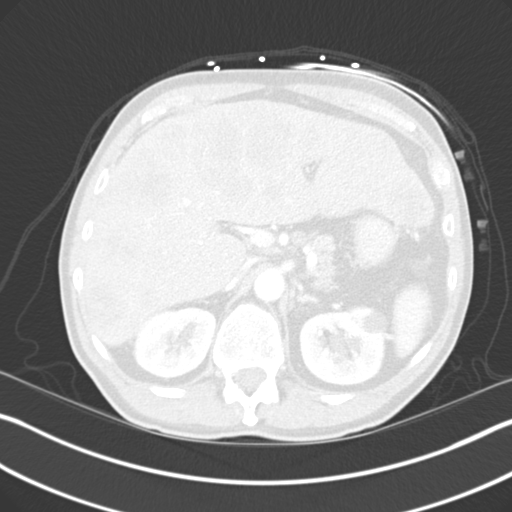
[im 85/133  lung]
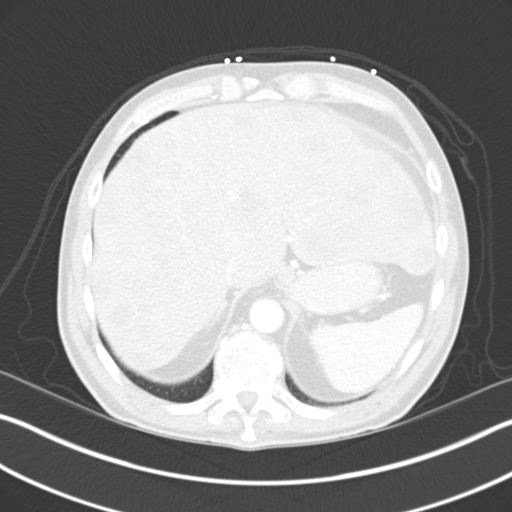
[im 97/133  lung]
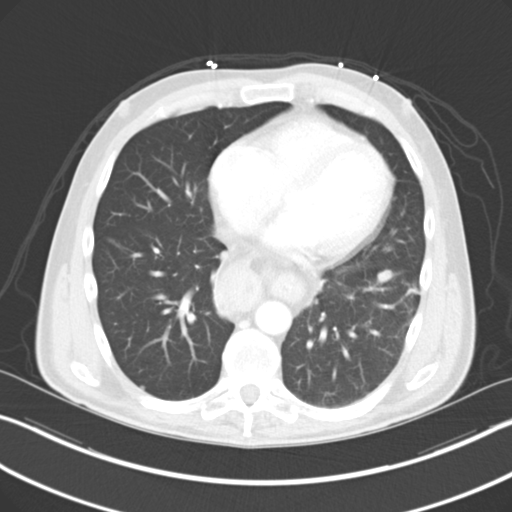
[im 109/133  mediastinal]
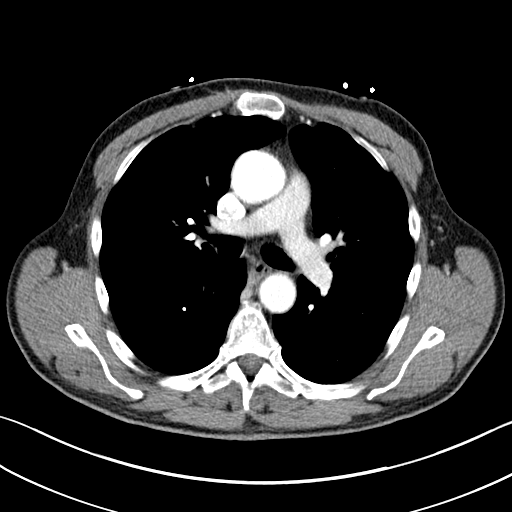
[im 109/133  lung]
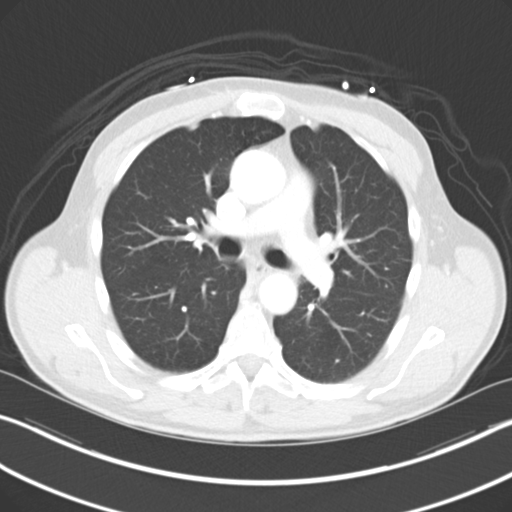
[im 121/133  lung]
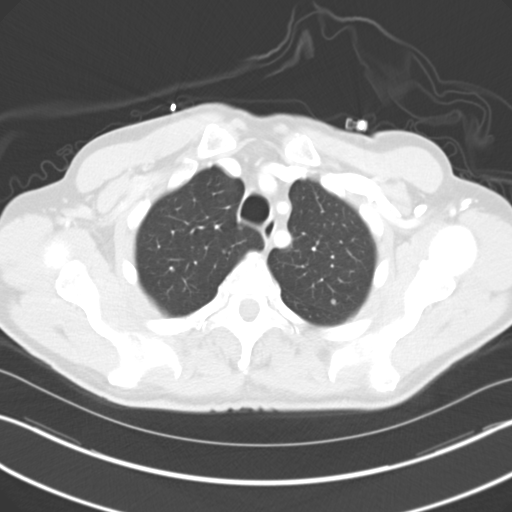

[Series 7: coronals · coronal · 0.86mm/px · 3 of 165 slices shown]
[im 33/165  lung]
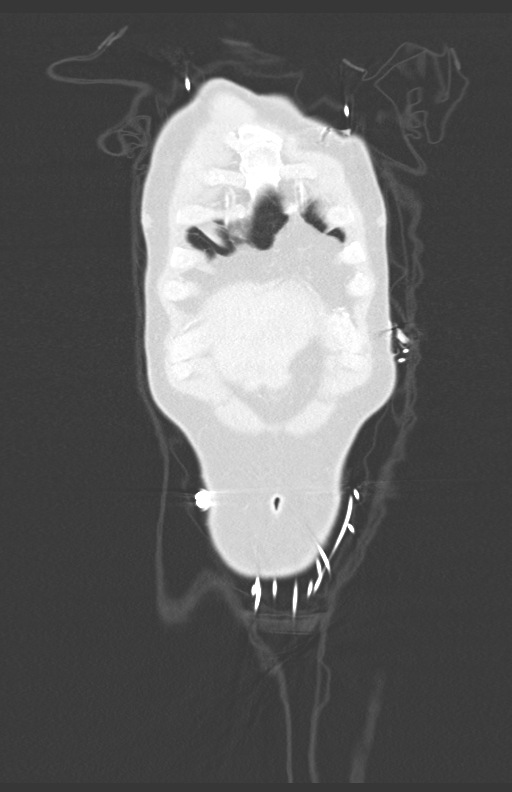
[im 66/165  lung]
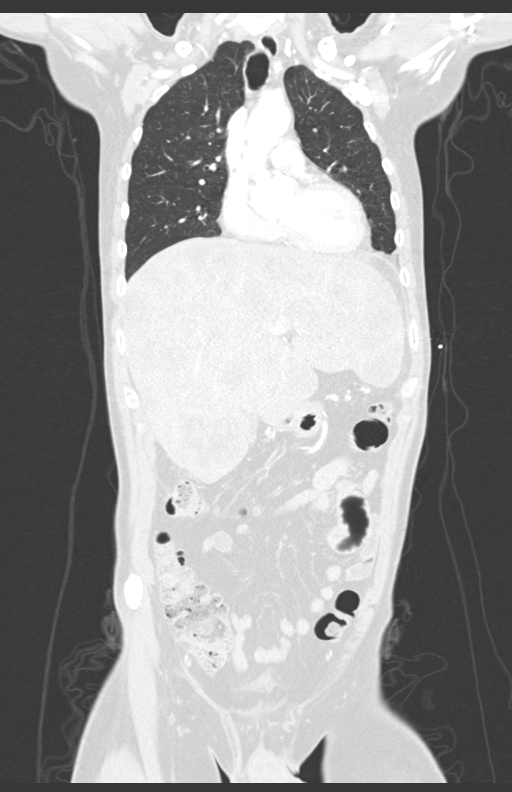
[im 99/165  lung]
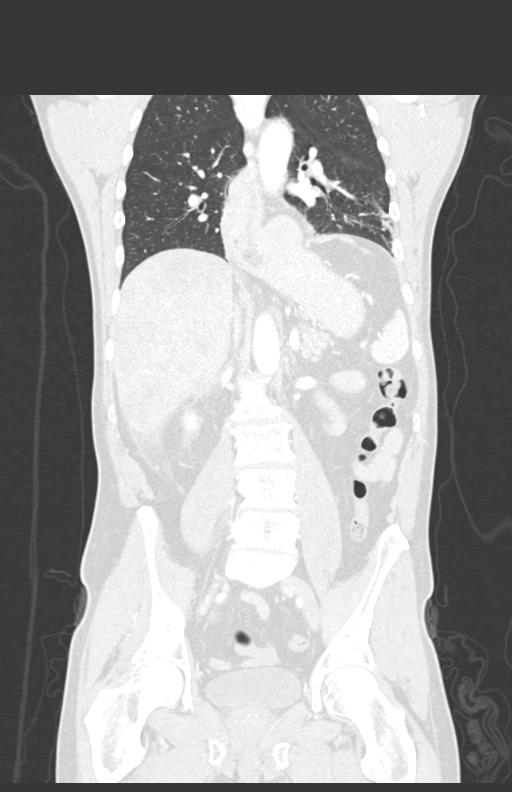

[13 of 36 positions shown; findings below may reference images not displayed]

FINDINGS: CT CHEST FINDINGS

Cardiovascular: Vascular structures are unremarkable. Heart size
normal. No pericardial effusion.

Mediastinum/Nodes: No pathologically enlarged mediastinal hilar or
axillary lymph. Esophagus is grossly unremarkable. Moderate to large
hiatal hernia.

Lungs/Pleura: Several scattered bilateral pulmonary nodules are seen
in a hematogenous distribution, measuring up to 8 x 10 mm in the
left lower lobe (series 5, image 91). No pleural fluid. Airway is
unremarkable.

Musculoskeletal: No worrisome lytic or sclerotic lesions.

CT ABDOMEN PELVIS FINDINGS

Hepatobiliary: Heterogeneous lesions are seen throughout liver,
measuring up to 5.1 x 10.3 cm in the right hepatic lobe. Gallbladder
is unremarkable. No biliary ductal dilatation.

Pancreas: Negative.

Spleen: Negative.

Adrenals/Urinary Tract: Adrenal glands are unremarkable. Probable
renal sinus cysts bilaterally. Additional low-attenuation lesions in
kidney measure up to 1 5 cm and are likely cysts. Ureters are
decompressed. Bladder is grossly unremarkable.

Stomach/Bowel: Moderate hiatal hernia. Stomach, small bowel,
appendix and colon are unremarkable.

Vascular/Lymphatic: Atherosclerotic calcification of the arterial
vasculature without abdominal aortic aneurysm. Gastrohepatic
ligament lymph nodes measure up to 11 mm. Periportal lymph nodes
measure up to 10 mm.

Reproductive: Prostate is visualized.

Other: Small to moderate pelvic free fluid. Mild stranding and a
small amount of fluid extend inferiorly from the right hepatic lobe
along the right paracolic gutter.

Musculoskeletal: Small area of sclerosis is seen in the medial right
iliac wing, adjacent to the sacroiliac joint nonspecific. Otherwise,
no worrisome lytic or sclerotic lesions.
IMPRESSION: 1. Pulmonary nodules and heterogeneous lesions throughout the liver
are indicative of metastatic disease.
2. Trace fluid and inflammatory stranding extend from the right
hepatic lobe along the right paracolic gutter. Small to moderate
pelvic ascites. Peritoneal carcinomatosis cannot be excluded.
3. Borderline enlarged gastrohepatic ligament and periportal lymph
nodes.
4. Small area of sclerosis in the medial right iliac wing is
indeterminate. Continued attention on follow-up exams is warranted
as metastatic disease cannot be excluded.
5. Moderate hiatal hernia.
6.  Aortic atherosclerosis ([id number]-[id number]).
